# Patient Record
Sex: Male | Born: 1970 | Race: White | Hispanic: No | Marital: Married
Health system: Midwestern US, Community
[De-identification: ages and names within clinical notes are randomized; demographics above are authoritative.]

## PROBLEM LIST (undated history)

## (undated) MED ORDER — AZITHROMYCIN 250 MG TAB: 250 mg | PACK | ORAL | Status: AC

## (undated) MED ORDER — HYDROCODONE 10 MG-CHLORPHENIRAMINE 8 MG/5 ML ORAL SUSP EXTEND.REL 12HR: 10-8 mg/5 mL | Freq: Two times a day (BID) | ORAL | Status: AC | PRN

---

## 2013-02-12 NOTE — ED Notes (Signed)
Seen, treated and released per Dr Kotsko.  DC RN Only.

## 2013-02-12 NOTE — ED Notes (Signed)
Chest congestion--dry cough--hoarse--can expectorate green sputum.

## 2013-02-12 NOTE — ED Provider Notes (Signed)
HPI Comments: Noah Beltran is a 43 y.o. male with no PMH who presents to the ED with complaints of chest congestion and cough for two days. The patient states that Noah Beltran can feel sputum in his chest that Noah Beltran states Noah Beltran has trouble coughing it up. The cough is productive with a green sputum. The patient denies body aches, fever, nausea, vomiting, shortness of breath, chest pain, and any other complaints at this time.     The history is provided by the patient.        History reviewed. No pertinent past medical history.     Past Surgical History   Procedure Laterality Date   ??? Hx orthopaedic  2004     Right index finger--benign tumor removed         History reviewed. No pertinent family history.     History     Social History   ??? Marital Status: MARRIED     Spouse Name: N/A     Number of Children: N/A   ??? Years of Education: N/A     Occupational History   ??? Not on file.     Social History Main Topics   ??? Smoking status: Former Smoker   ??? Smokeless tobacco: Never Used   ??? Alcohol Use: No   ??? Drug Use: No   ??? Sexually Active: Not on file     Other Topics Concern   ??? Not on file     Social History Narrative   ??? No narrative on file                  ALLERGIES: Penicillins      Review of Systems   Constitutional: Negative for fever and chills.   HENT: Negative for congestion and sore throat.    Eyes: Negative for redness and visual disturbance.   Respiratory: Positive for cough. Negative for shortness of breath.    Cardiovascular: Negative for chest pain.   Gastrointestinal: Negative for vomiting, abdominal pain and diarrhea.   Genitourinary: Negative for difficulty urinating.   Musculoskeletal: Negative for myalgias and arthralgias.   Skin: Negative for rash.   Neurological: Negative for headaches.   Psychiatric/Behavioral: Negative for dysphoric mood.   All other systems reviewed and are negative.        Filed Vitals:    02/12/13 0434   BP: 141/95   Pulse: 94   Temp: 98.4 ??F (36.9 ??C)   Resp: 14   Height: 5\' 9"  (1.753 m)    Weight: 95.255 kg (210 lb)   SpO2: 100%            Physical Exam   Nursing note and vitals reviewed.  Constitutional: Noah Beltran is oriented to person, place, and time. Noah Beltran appears well-developed and well-nourished. No distress.   HENT:   Head: Normocephalic and atraumatic.   Mouth/Throat: Oropharynx is clear and moist.   Eyes: Conjunctivae and EOM are normal. Pupils are equal, round, and reactive to light. No scleral icterus.   Neck: Normal range of motion. Neck supple.   Cardiovascular: Normal rate, regular rhythm and normal heart sounds.    No murmur heard.  Pulmonary/Chest: Effort normal and breath sounds normal. No respiratory distress.   Mild bronchiole sounds bilaterally.    Abdominal: Soft. Bowel sounds are normal. Noah Beltran exhibits no distension. There is no tenderness.   Musculoskeletal: Noah Beltran exhibits no edema.   Lymphadenopathy:     Noah Beltran has no cervical adenopathy.   Neurological: Noah Beltran is alert and  oriented to person, place, and time. Coordination normal.   Skin: Skin is warm and dry. No rash noted.   Psychiatric: Noah Beltran has a normal mood and affect. His behavior is normal.        MDM     Differential Diagnosis; Clinical Impression; Plan:     Bronchitis abx fu pcp   Amount and/or Complexity of Data Reviewed:    Review and summarize past medical records:  Yes      Procedures    -------------------------------------------------------------------------------------------------------------------  Orders:  Orders Placed This Encounter   ??? chlorpheniramine-HYDROcodone (TUSSIONEX PENNKINETIC ER) 8-10 mg/5 mL suspension     Sig: Take 5 mL by mouth every twelve (12) hours as needed for Cough.     Dispense:  60 mL     Refill:  0   ??? azithromycin (ZITHROMAX Z-PAK) 250 mg tablet     Sig: Take two tablets today then one tablet daily     Dispense:  1 Package     Refill:  0                EKG Interpretation & Lab Results:   No results found for this or any previous visit (from the past 12 hour(s)).    Radiology Results:   none    Consultations:   none    Progress Notes:  4:42 AM:  Dr. Diona Browner, MD at the bedside evaluating the patient. Answered the patient's questions regarding treatment and the patient understands.     Scribe Attestation:   written by: Beecher Mcardle, (4:42 AM) scribing for and in the presence of Dr.Tabetha Haraway Marlene Lard, MD ED Provider (4:42 AM).    -------------------------------------------------------------------------------------------------------------------    PROVIDER ATTESTATION STATEMENT  I personally performed the services described in the documentation, reviewed the documentation, as recorded by the scribe in my presence, and it accurately and completely records my words and actions.  Dr.Koray Soter Marlene Lard, MD 3:20 AM      Disposition:  Diagnosis:   1. Acute bronchitis          Disposition: discharged     Follow-up Information    Follow up With Details Comments Contact Info    [facility]  Call in 2 days            Current Discharge Medication List      START taking these medications    Details   chlorpheniramine-HYDROcodone (TUSSIONEX PENNKINETIC ER) 8-10 mg/5 mL suspension Take 5 mL by mouth every twelve (12) hours as needed for Cough.  Qty: 60 mL, Refills: 0      azithromycin (ZITHROMAX Z-PAK) 250 mg tablet Take two tablets today then one tablet daily  Qty: 1 Package, Refills: 0

## 2018-08-21 IMAGING — MR TECH MRI SHOULDER RT WO CONTRAST
4 of 5 series · 30 of 40 positions shown · non-contrast
Comparison: none

HISTORY: pain in right shoulder

Tech exam only.

[Series 6: t2_cor_fs blade · coronal · right · 3.0mm · 0.47mm/px · 7 of 19 slices shown]
[im 1/19]
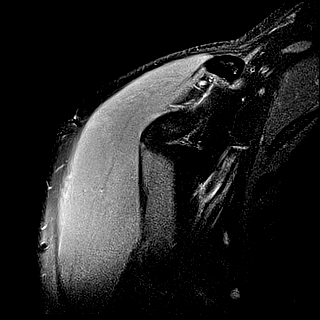
[im 4/19]
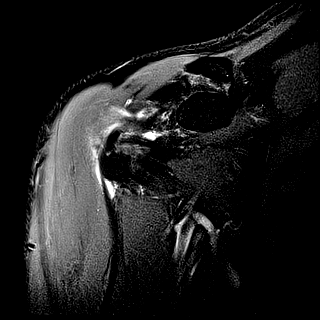
[im 7/19]
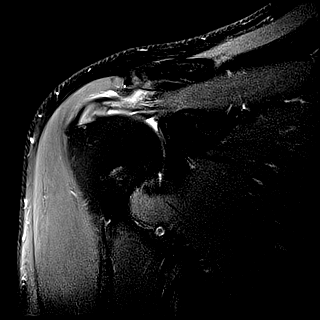
[im 10/19]
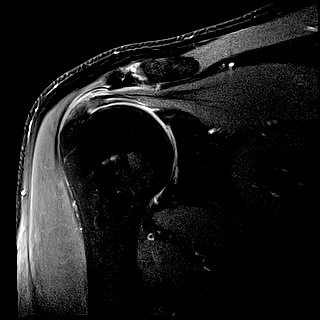
[im 13/19]
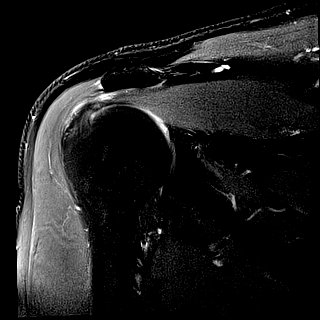
[im 16/19]
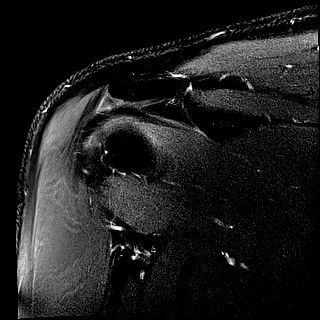
[im 19/19]
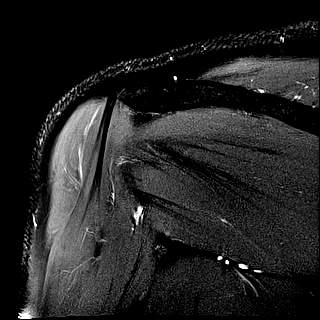

[Series 7: t2_sag_fs(blade) · sagittal · right · 3.0mm · 0.44mm/px · 8 of 28 slices shown]
[im 1/28]
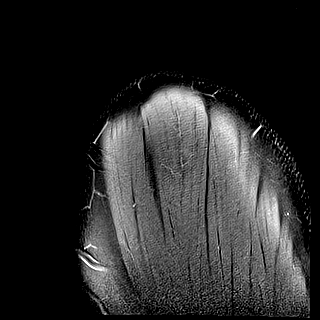
[im 4/28]
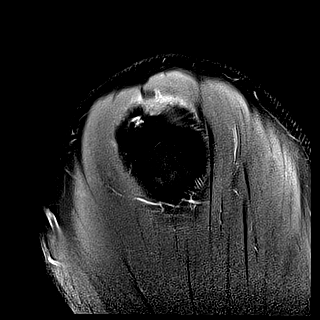
[im 8/28]
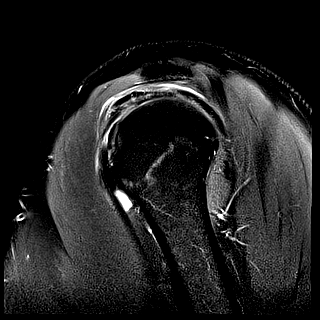
[im 12/28]
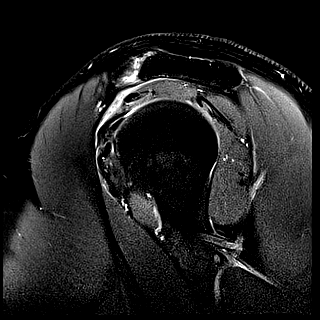
[im 16/28]
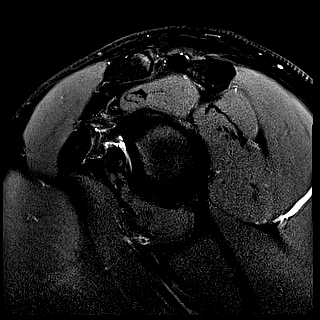
[im 20/28]
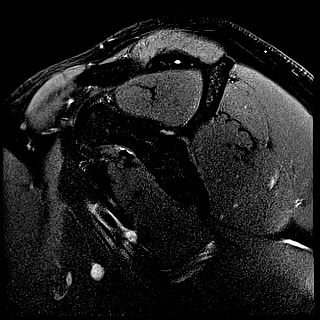
[im 24/28]
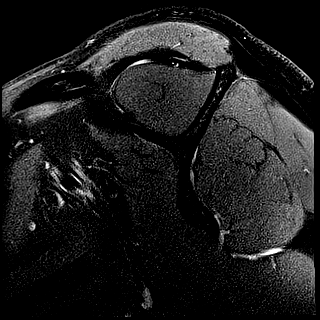
[im 28/28]
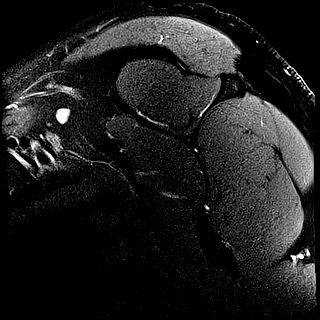

[Series 8: t1_sag_obl · sagittal · right · 3.0mm · 0.36mm/px · 8 of 28 slices shown]
[im 1/28]
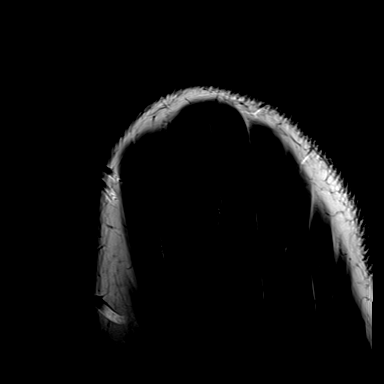
[im 4/28]
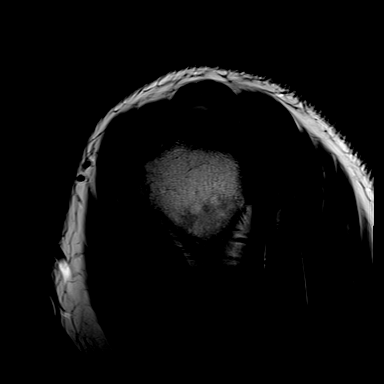
[im 8/28]
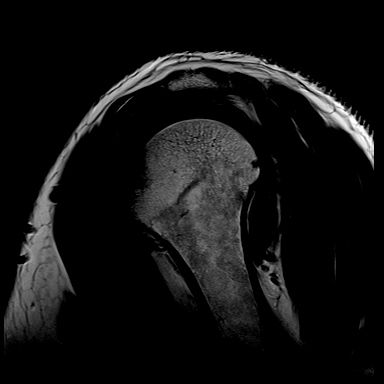
[im 12/28]
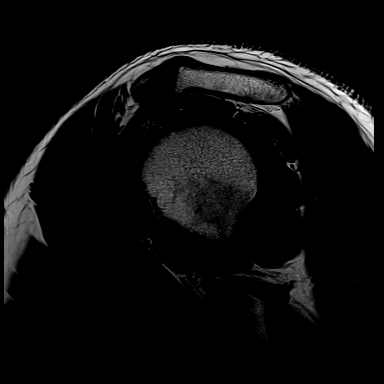
[im 16/28]
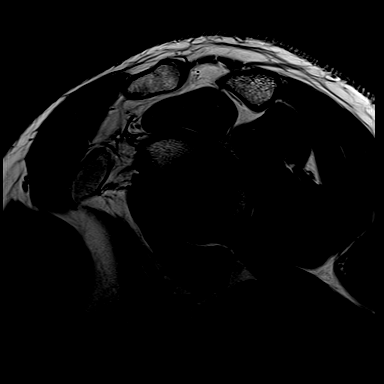
[im 20/28]
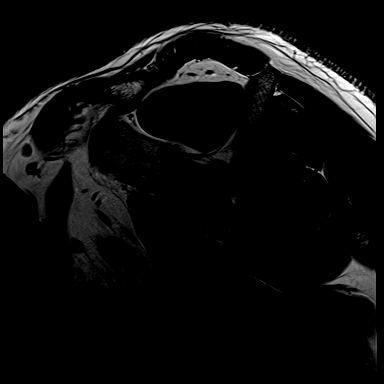
[im 24/28]
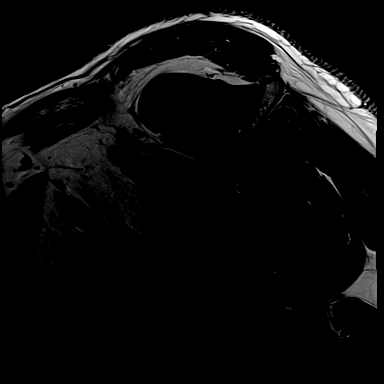
[im 28/28]
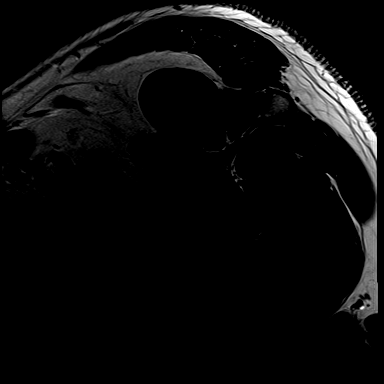

[Series 9: t2_axial_fs_blade · axial · right · 3.0mm · 0.62mm/px · z∈[-42,+50]mm · 7 of 24 slices shown]
[im 1/24]
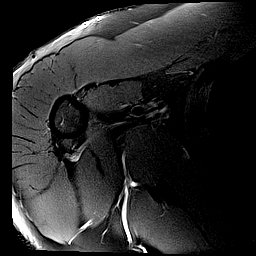
[im 4/24]
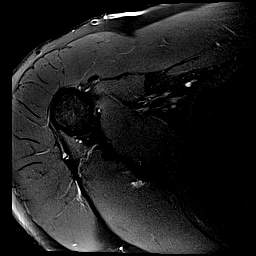
[im 8/24]
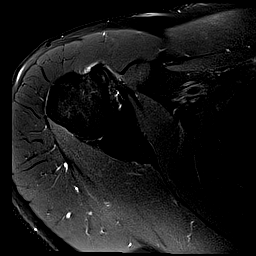
[im 12/24]
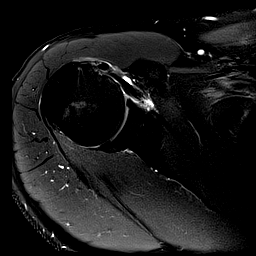
[im 16/24]
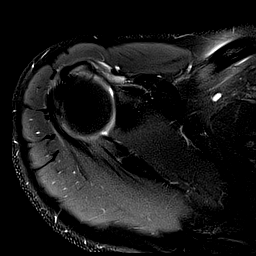
[im 20/24]
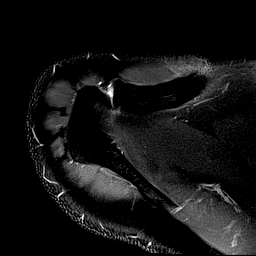
[im 24/24]
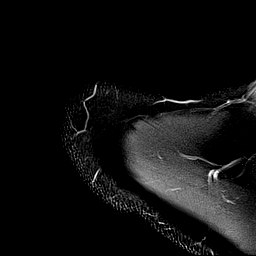

[30 of 40 positions shown; findings below may reference images not displayed]

IMPRESSION: Images not interpreted by [HOSPITAL].

## 2020-06-06 IMAGING — MR MRI BRAIN W/WO CONTRAST
11 series · 48 of 48 positions shown · IV contrast (prohance)
Comparison: None

INDICATION: 50 years-old male with asymmetric tinnitus.
TECHNIQUE: Multiplanar, multisequence MRI of the brain was performed without and with intravenous contrast. The patient received an intravenous dose of 15 mL ProHance. IAC protocol was used.

[Series 5: t1_sag · sagittal · 4.0mm · 0.69mm/px · 2 of 25 slices shown]
[im 1/25]
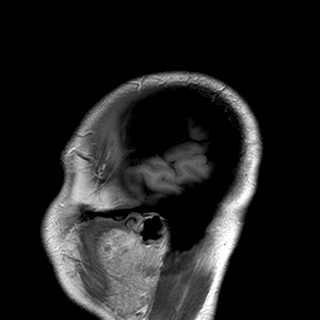
[im 25/25]
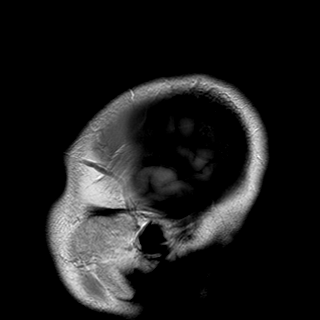

[Series 6: flair_axial_fs · axial · 4.0mm · 0.75mm/px · z∈[-37,+113]mm · 3 of 30 slices shown]
[im 1/30]
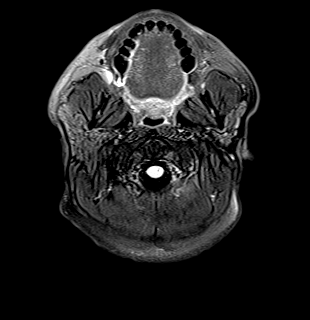
[im 15/30]
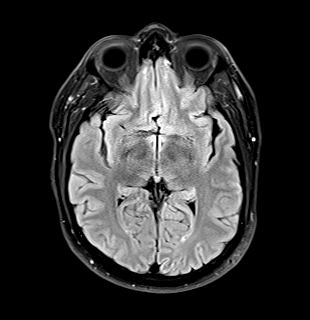
[im 30/30]
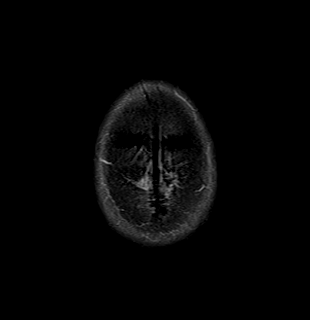

[Series 7: DWI · axial · 4.0mm · 0.94mm/px · z∈[-37,+113]mm · 4 of 30 slices shown (1 of 2)]
[im 1/30]
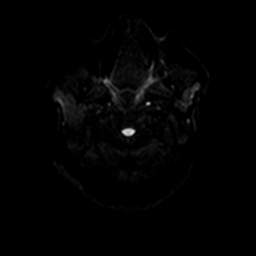
[im 10/30]
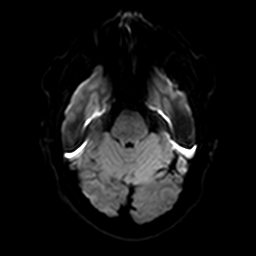
[im 20/30]
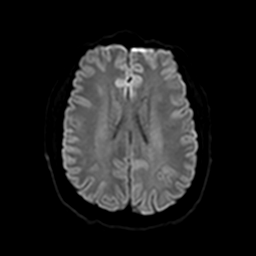
[im 30/30]
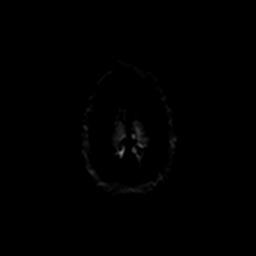

[Series 8: DWI · axial · 4.0mm · 0.94mm/px · z∈[-37,+113]mm · 4 of 30 slices shown (2 of 2)]
[im 1/30]
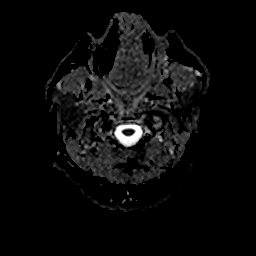
[im 10/30]
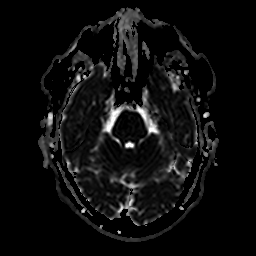
[im 20/30]
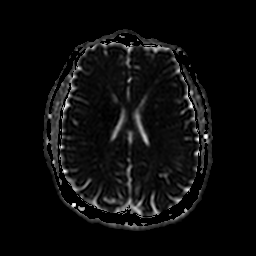
[im 30/30]
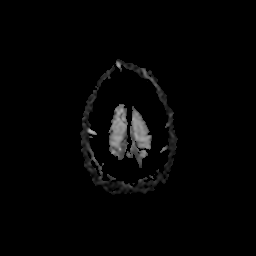

[Series 9: flash_axial · axial · 4.0mm · 0.75mm/px · z∈[-37,+113]mm · 4 of 30 slices shown]
[im 1/30]
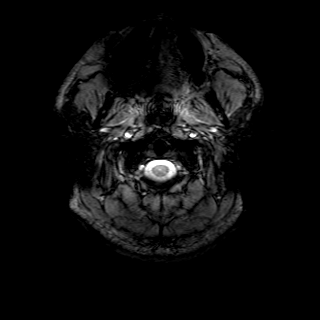
[im 10/30]
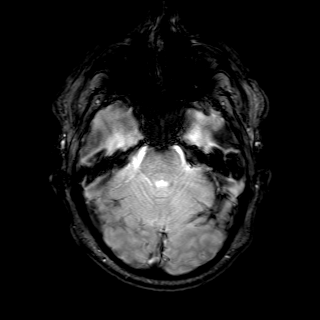
[im 20/30]
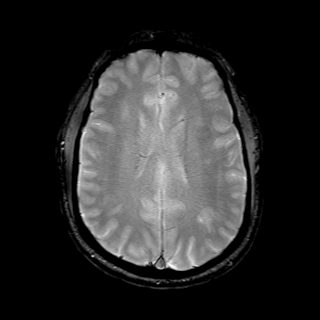
[im 30/30]
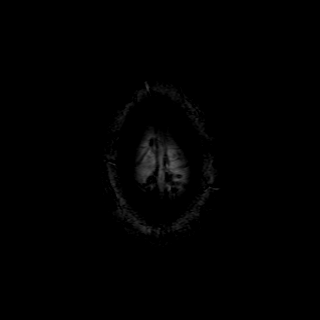

[Series 10: t1_axial · axial · 4.0mm · 0.75mm/px · z∈[-37,+113]mm · 4 of 30 slices shown]
[im 1/30]
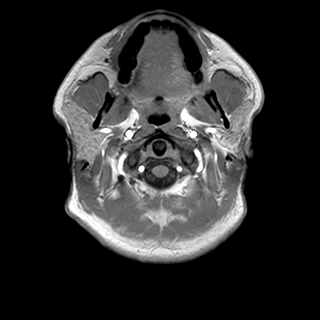
[im 10/30]
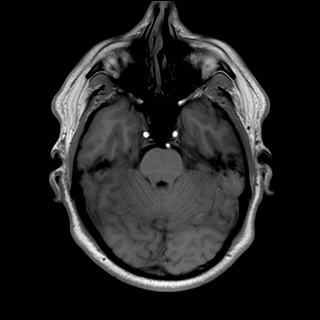
[im 20/30]
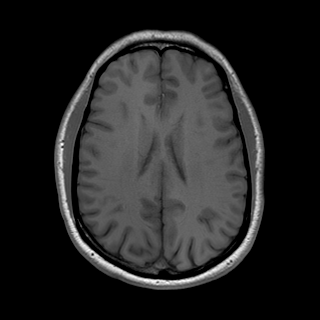
[im 30/30]
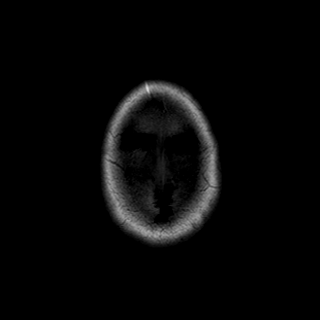

[Series 11: t2_ci3d_axial · axial · 0.9mm · 0.59mm/px · z∈[-25,+21]mm · 6 of 52 slices shown]
[im 1/52]
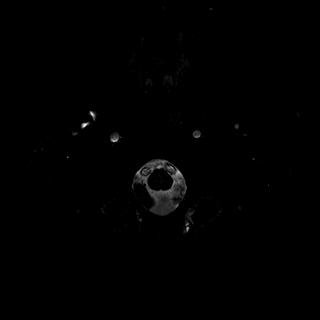
[im 11/52]
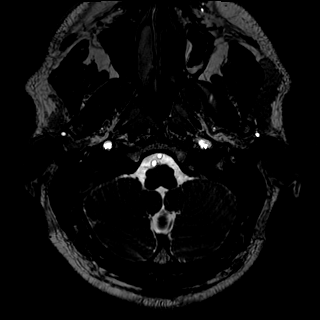
[im 21/52]
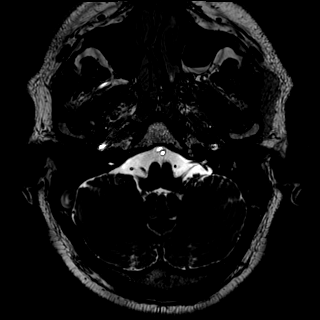
[im 31/52]
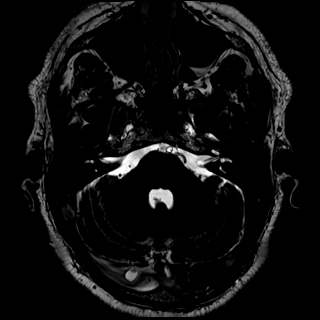
[im 41/52]
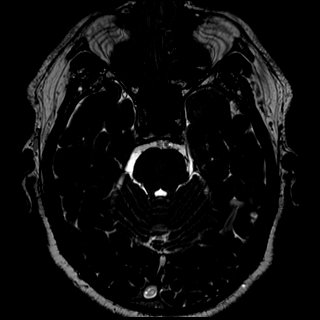
[im 52/52]
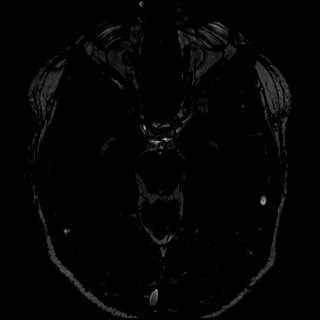

[Series 12: t1_vibe_axial_fs · axial · 1.0mm · 0.66mm/px · z∈[-33,+30]mm · 8 of 64 slices shown]
[im 1/64]
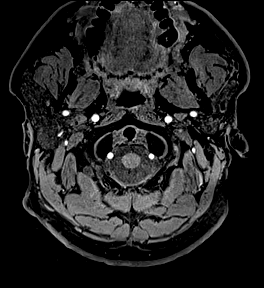
[im 10/64]
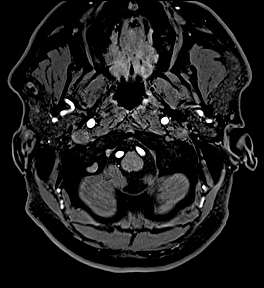
[im 19/64]
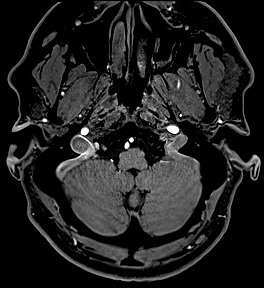
[im 28/64]
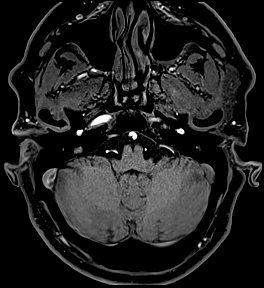
[im 37/64]
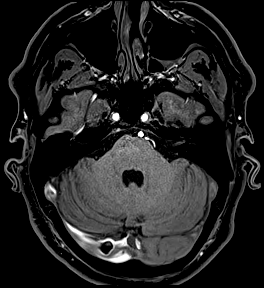
[im 46/64]
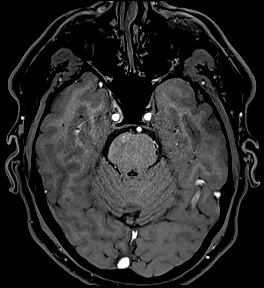
[im 55/64]
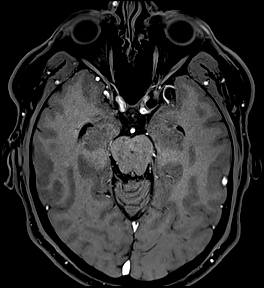
[im 64/64]
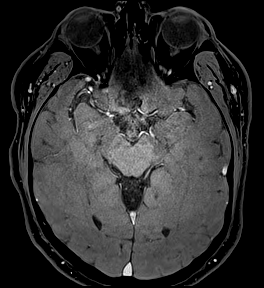

[Series 13: t1_vibe_axial_fs +c · axial · 1.0mm · 0.66mm/px · z∈[-33,+30]mm · 8 of 64 slices shown]
[im 1/64]
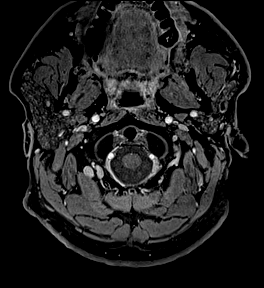
[im 10/64]
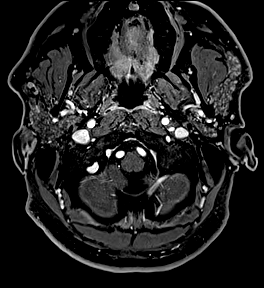
[im 19/64]
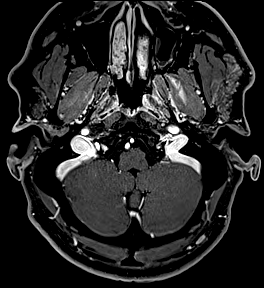
[im 28/64]
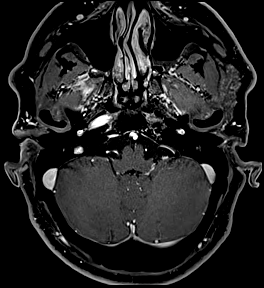
[im 37/64]
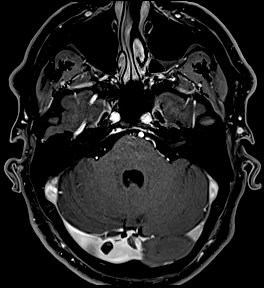
[im 46/64]
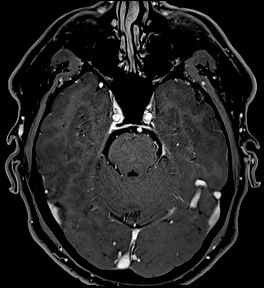
[im 55/64]
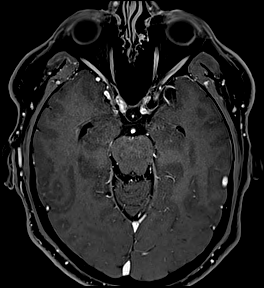
[im 64/64]
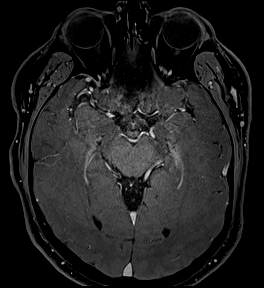

[Series 14: t1_axial_+c · axial · 4.0mm · 0.75mm/px · z∈[-37,+113]mm · 4 of 30 slices shown]
[im 1/30]
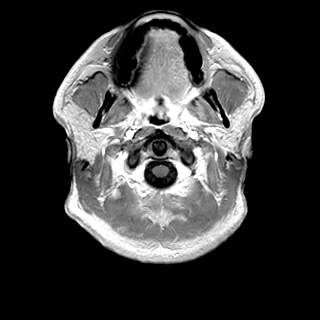
[im 10/30]
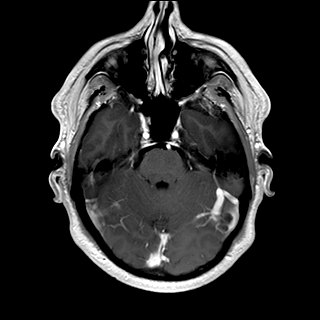
[im 20/30]
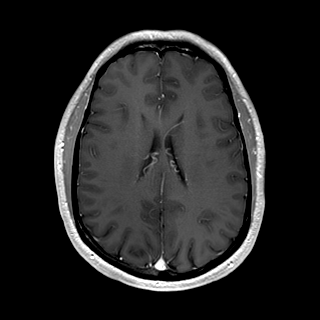
[im 30/30]
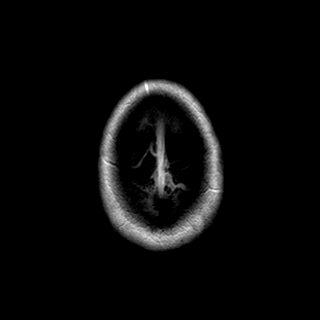

[Series 100: mpr cor iac · coronal · 1.0mm · 0.66mm/px · 1 of 3 slices shown]
[im 1/3]
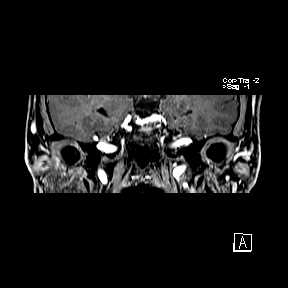

[48 of 48 positions shown; findings below may reference images not displayed]

FINDINGS: Temporal bones: No internal auditory canal mass. No nodularity of cranial nerves VII and VIII. Normal signal of the inner ear structures. No semicircular canal dehiscence. No abnormal contrast enhancement. Normal enhancement of the facial nerves.

Brain parenchyma: No acute infarct or hemorrhage. No parenchymal signal abnormality.

Ventricles: No midline shift, herniation or hydrocephalus.

Extra-axial spaces: No extra-axial fluid collection.

Extracranial structures: Mild mucosal thickening of the bilateral ethmoid air cells. No suspicious osseous lesion. Orbits unremarkable. Soft tissues normal.

Contrast enhanced views are negative for pathologic enhancement.
IMPRESSION: No abnormality to explain patient's symptoms.

## 2020-08-14 IMAGING — MR MRI HAND RT WO CONTRAST
6 of 7 series · 39 of 40 positions shown · non-contrast
Comparison: None.

INDICATION: Pain to right wrist, hand, fingers.
TECHNIQUE: Multiplanar, multiecho MR imaging of the right hand was performed, including T1-weighted and fluid-sensitive sequences without intravenous contrast administration.

[Series 4: t1_cor · coronal · right · 2.0mm · 0.39mm/px · 5 of 26 slices shown]
[im 1/26]
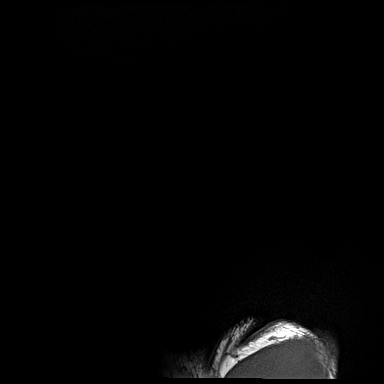
[im 7/26]
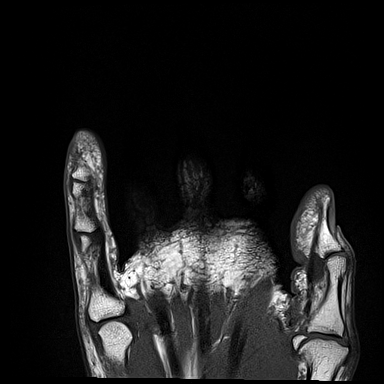
[im 13/26]
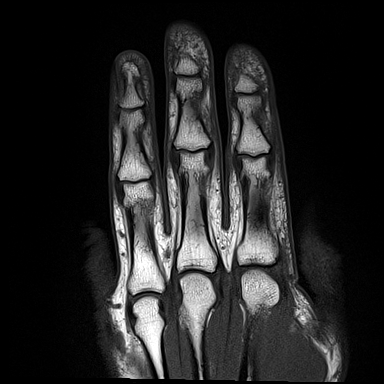
[im 19/26]
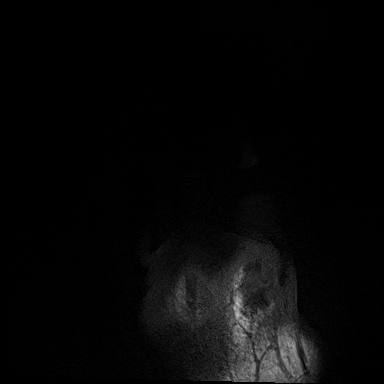
[im 26/26]
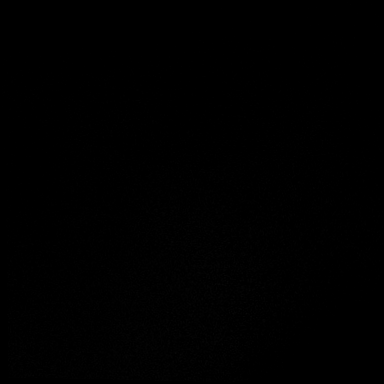

[Series 5: t2_cor_fs · coronal · right · 2.0mm · 0.47mm/px · 5 of 26 slices shown]
[im 1/26]
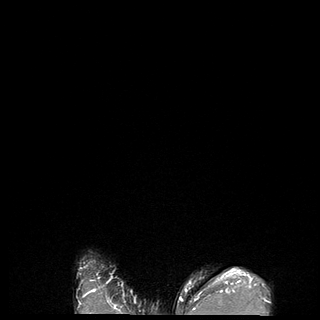
[im 7/26]
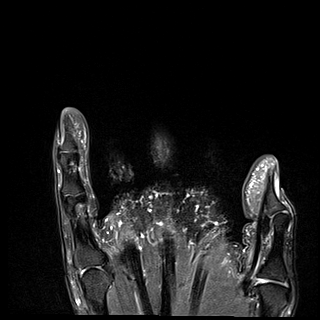
[im 13/26]
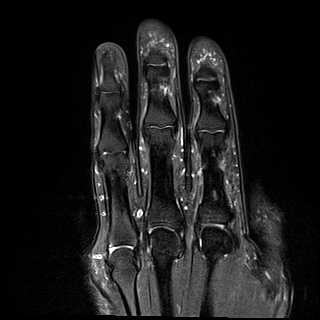
[im 19/26]
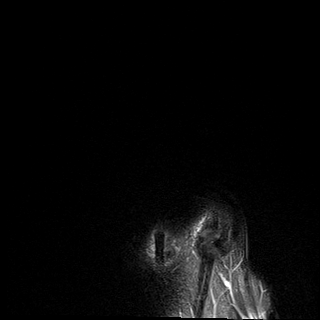
[im 26/26]
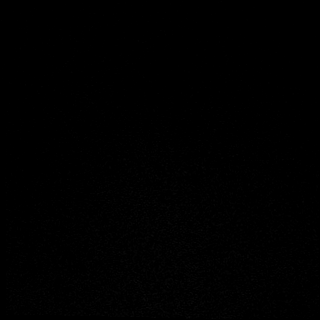

[Series 6: t1_axial · axial · right · 3.0mm · 0.38mm/px · z∈[+84,+199]mm · 8 of 40 slices shown]
[im 1/40]
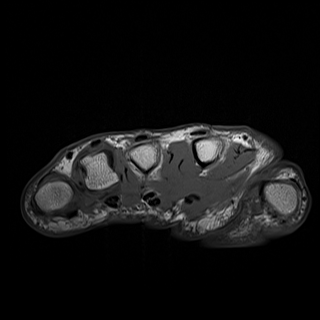
[im 6/40]
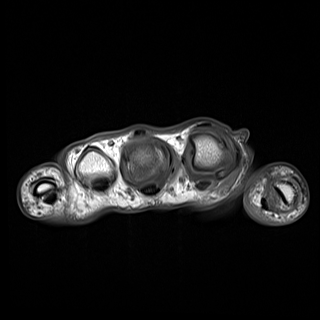
[im 12/40]
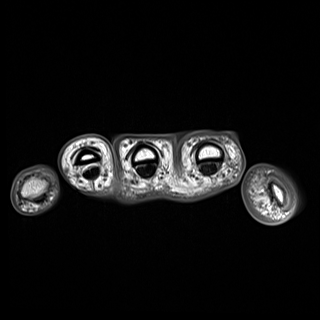
[im 17/40]
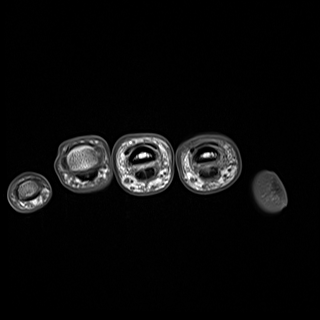
[im 23/40]
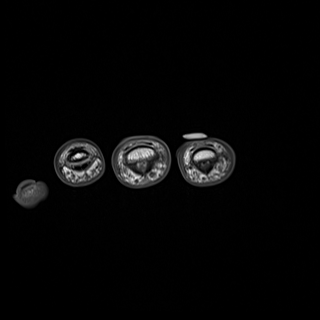
[im 28/40]
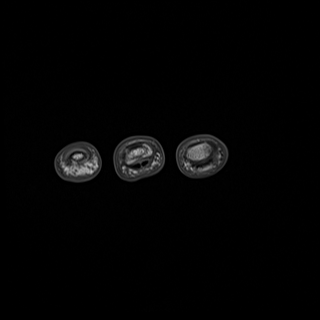
[im 34/40]
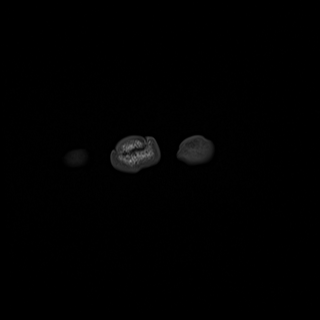
[im 40/40]
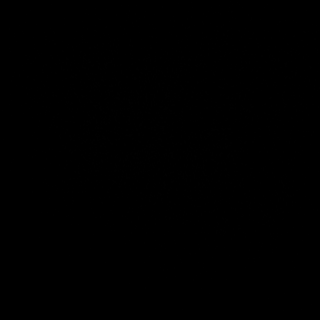

[Series 7: t2_axial_fs · axial · right · 3.0mm · 0.38mm/px · z∈[+84,+199]mm · 8 of 40 slices shown]
[im 1/40]
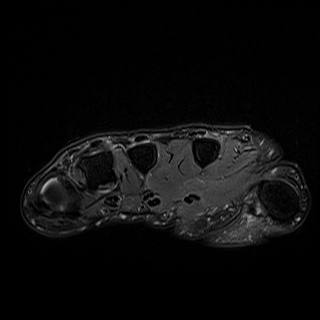
[im 6/40]
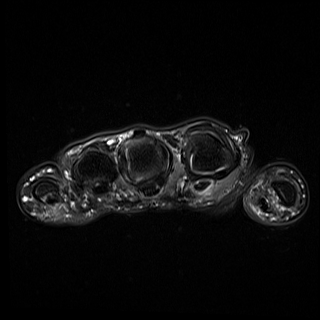
[im 12/40]
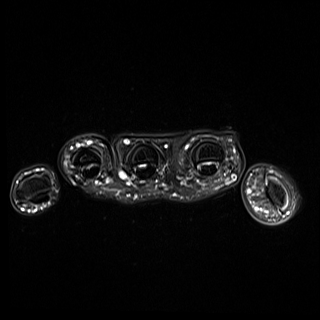
[im 17/40]
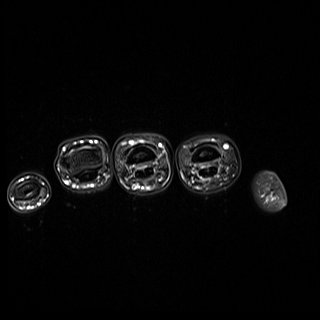
[im 23/40]
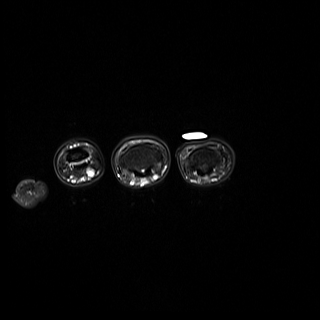
[im 28/40]
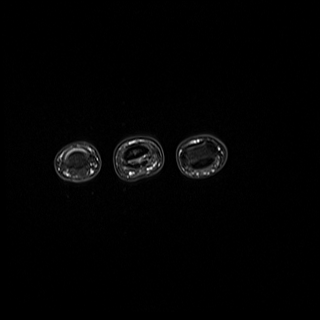
[im 34/40]
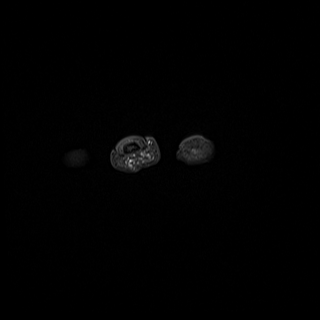
[im 40/40]
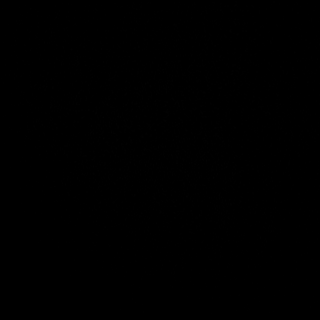

[Series 8: t2_sag_fs · sagittal · right · 3.0mm · 0.47mm/px · 6 of 33 slices shown]
[im 1/33]
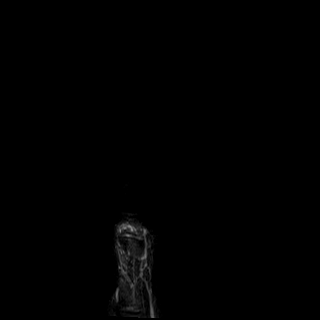
[im 7/33]
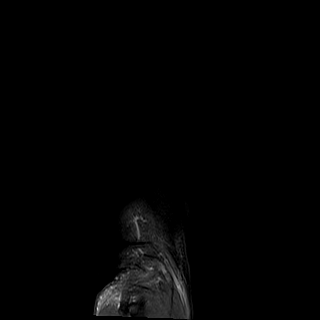
[im 13/33]
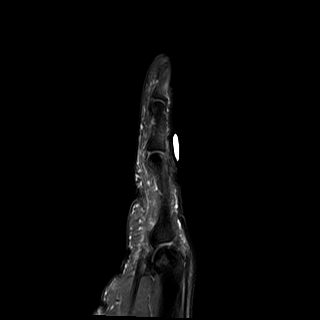
[im 20/33]
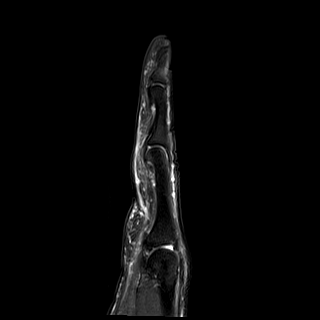
[im 26/33]
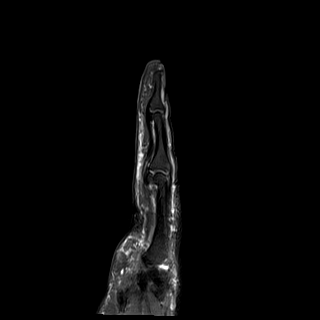
[im 33/33]
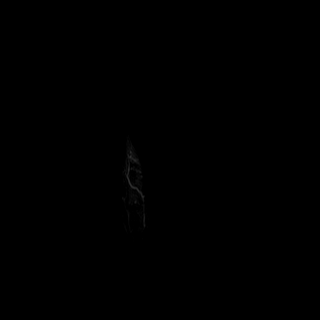

[Series 9: t1_sag · sagittal · right · 3.0mm · 0.47mm/px · 7 of 34 slices shown]
[im 1/34]
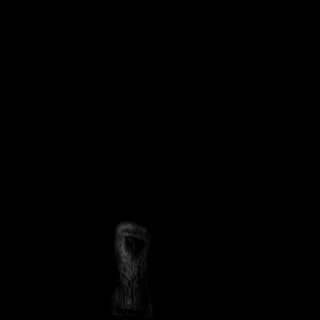
[im 6/34]
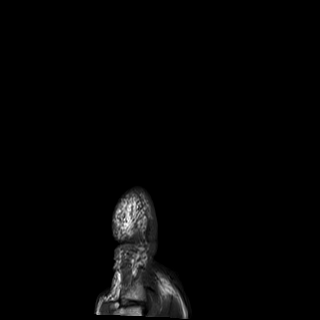
[im 12/34]
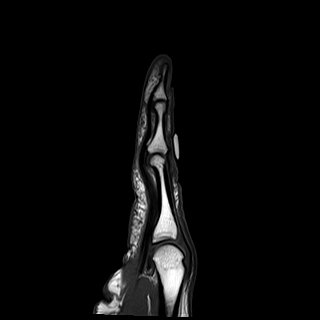
[im 17/34]
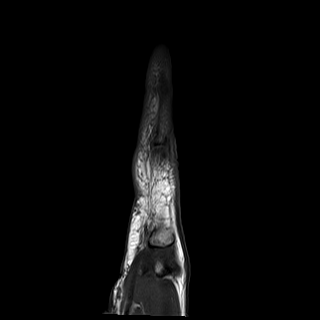
[im 23/34]
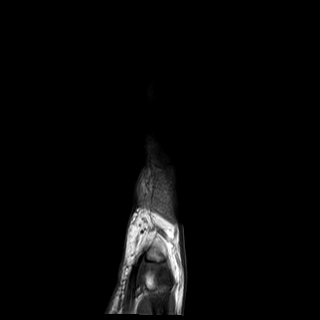
[im 28/34]
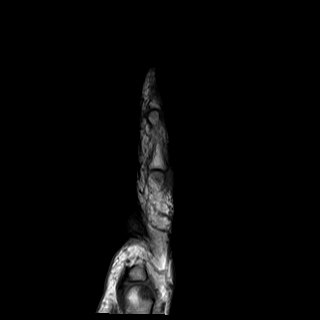
[im 34/34]
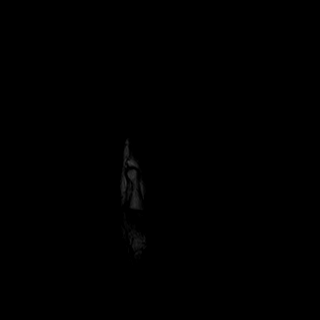

[39 of 40 positions shown; findings below may reference images not displayed]

FINDINGS: No acute fracture. No bone contusion. No erosion. Mild first MCP joint DJD. No joint effusion.

Collateral ligaments are intact. No tenosynovitis. Visualized flexor and extensor tendons are intact. No acute muscle injury. No muscle atrophy. No fluid collection. Pulley systems are intact. No suspicious soft tissue mass.

No soft tissue abnormality at the region of interest along the dorsal aspect of the second digit.
IMPRESSION: 1.
No acute osseous finding.

2.
No soft tissue abnormality at the region of interest along the dorsal aspect of the second digit.

3.
Mild first MCP joint DJD.

## 2020-08-14 IMAGING — MR MRI WRIST RT WO CONTRAST
6 of 7 series · 37 of 40 positions shown · non-contrast
Comparison: None.

INDICATION: Pain to right wrist, hand, fingers.
TECHNIQUE: Multiplanar, multiecho imaging of the right wrist was performed, including T1-weighted and fluid sensitive sequences without intravenous contrast administration.

[Series 4: t1_axial · axial · right · 3.0mm · 0.26mm/px · z∈[-50,+14]mm · 6 of 22 slices shown]
[im 1/22]
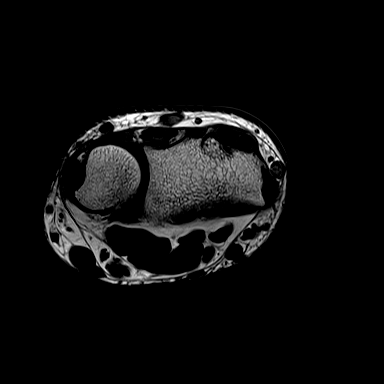
[im 5/22]
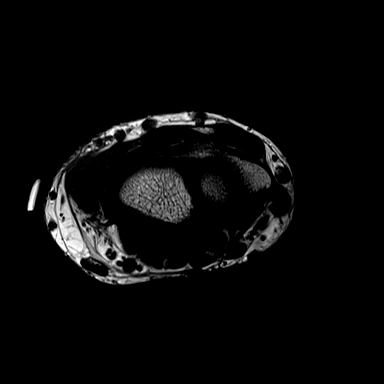
[im 9/22]
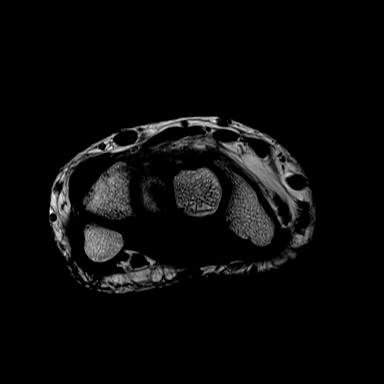
[im 13/22]
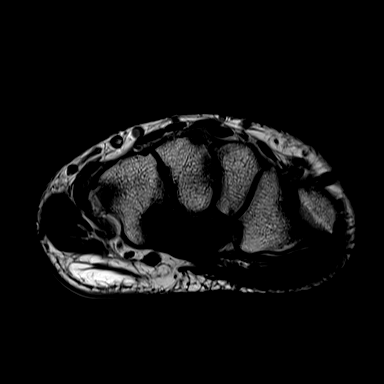
[im 17/22]
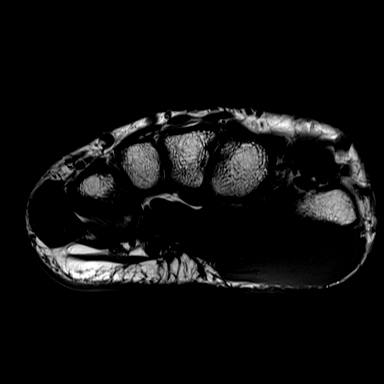
[im 22/22]
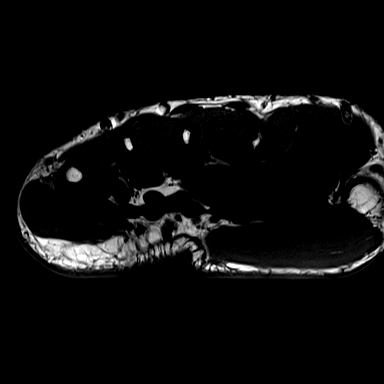

[Series 5: t2_axial_fs · axial · right · 3.0mm · 0.32mm/px · z∈[-49,+15]mm · 6 of 22 slices shown]
[im 1/22]
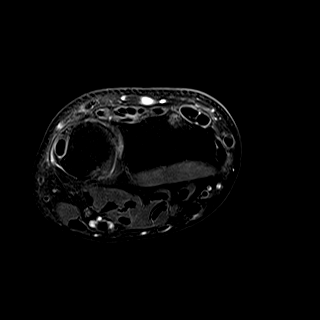
[im 5/22]
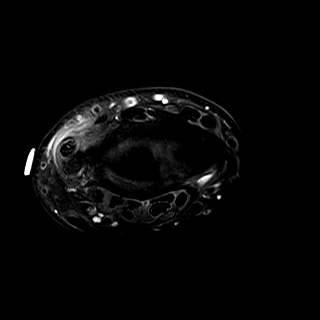
[im 9/22]
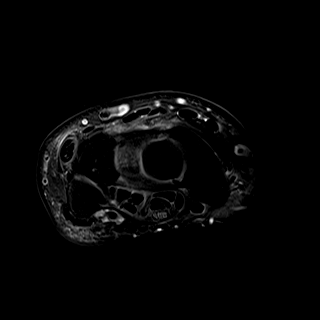
[im 13/22]
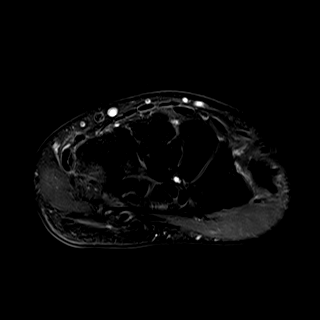
[im 17/22]
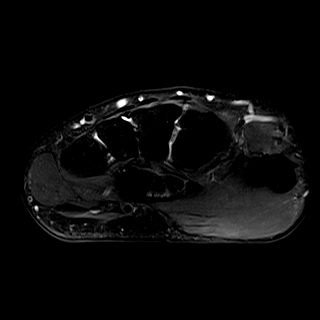
[im 22/22]
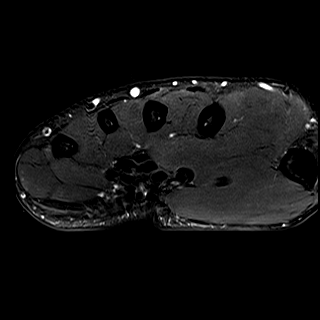

[Series 6: t1_cor · coronal · right · 3.0mm · 0.26mm/px · 4 of 15 slices shown]
[im 1/15]
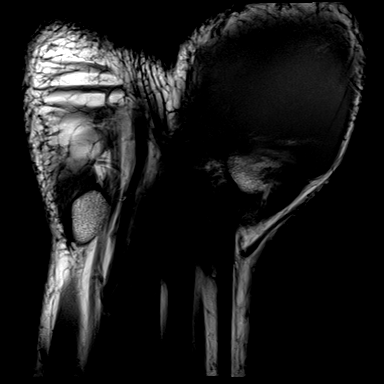
[im 5/15]
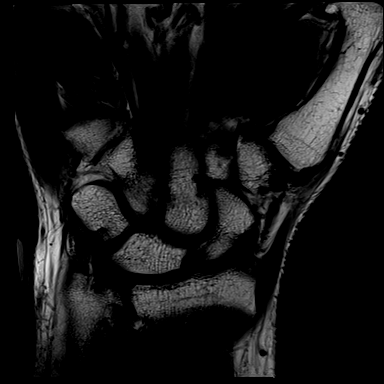
[im 10/15]
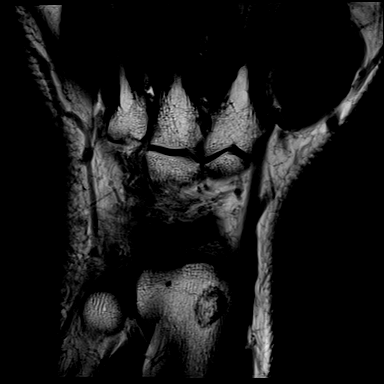
[im 15/15]
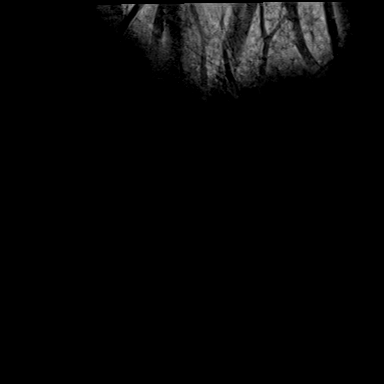

[Series 8: t2_sag_fs · sagittal · right · 3.0mm · 0.39mm/px · 6 of 22 slices shown]
[im 1/22]
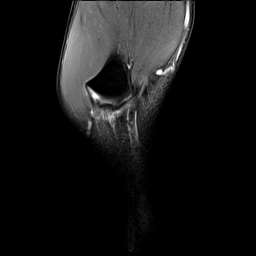
[im 5/22]
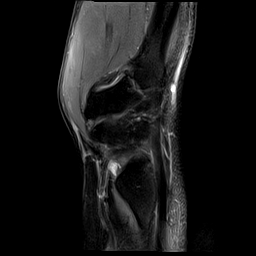
[im 9/22]
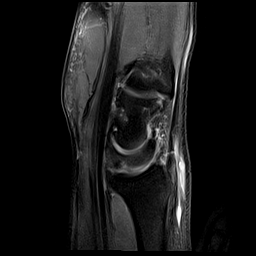
[im 13/22]
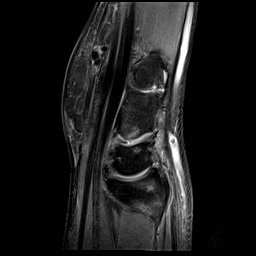
[im 17/22]
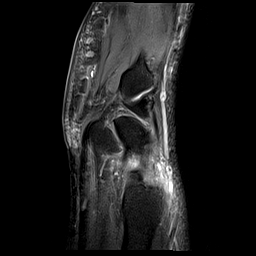
[im 22/22]
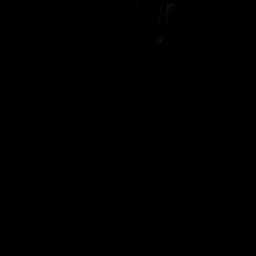

[Series 9: t2_cor_fs_space · coronal · right · 0.9mm · 0.33mm/px · 11 of 42 slices shown]
[im 1/42]
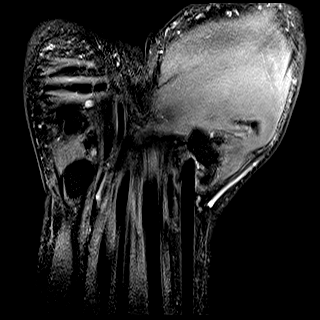
[im 5/42]
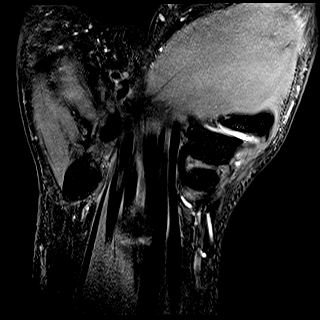
[im 9/42]
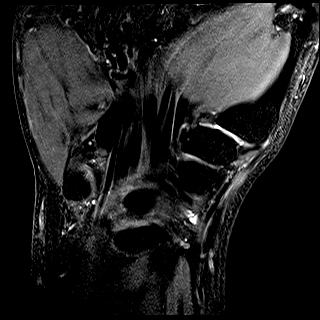
[im 13/42]
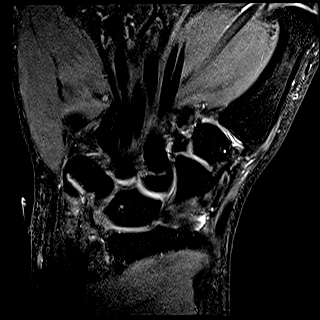
[im 17/42]
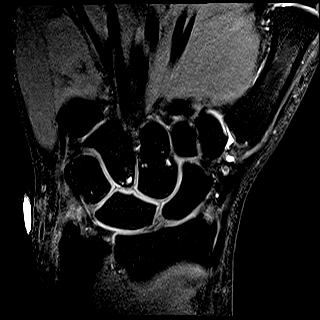
[im 21/42]
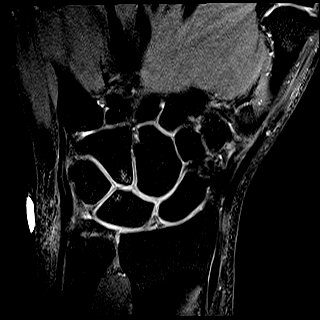
[im 25/42]
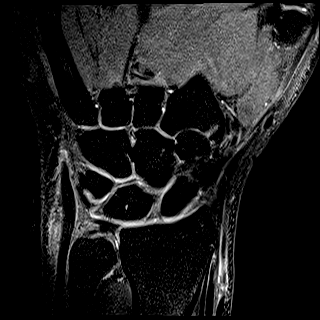
[im 29/42]
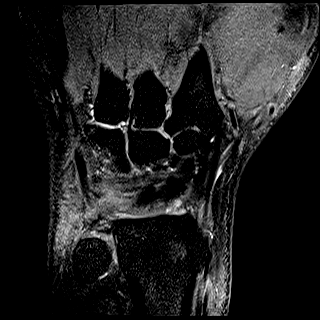
[im 33/42]
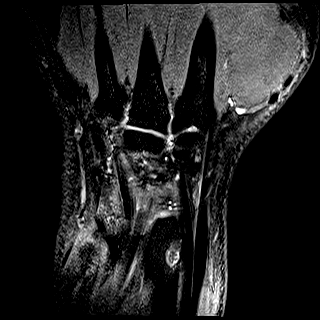
[im 37/42]
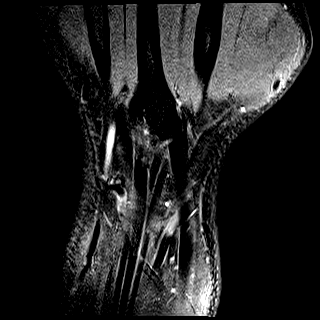
[im 42/42]
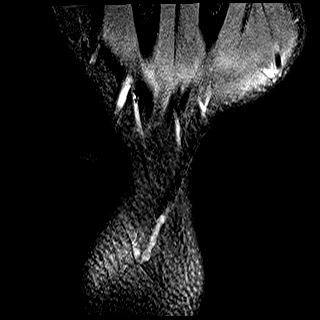

[Series 10: t2_cor_fs · coronal · right · 3.0mm · 0.31mm/px · 4 of 15 slices shown]
[im 1/15]
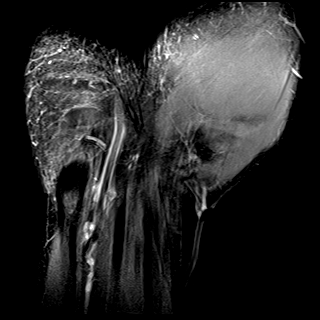
[im 5/15]
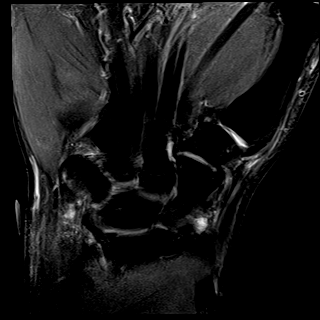
[im 10/15]
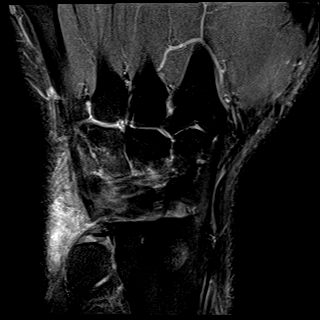
[im 15/15]
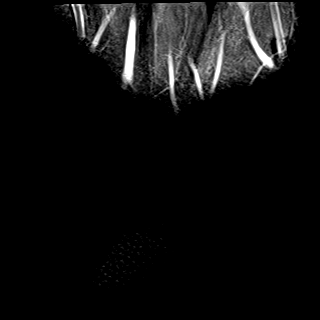

[37 of 40 positions shown; findings below may reference images not displayed]

FINDINGS: Extensor tendons: Moderate tendinosis of the extensor carpi ulnaris, at the level of the ulnar styloid.

Flexor tendons:  Intact. No tenosynovitis.

Triangular fibrocartilage complex: TFC is intact. Degeneration of the meniscal homolog. Thickening and scarring of the ulnar collateral ligament of the wrist, suggesting old injury.

Scapholunate ligament: Intact.

Lunotriquetral ligament: Intact.

Carpal bones: No acute fracture. No bone contusion. No erosion. No AVN.

Cartilage: Intact.

Carpal tunnel: Normal.

Other: No acute muscle injury. Moderate subcutaneous edema near the ulnar styloid. Subcortical marrow edema at the dorsal aspect of the distal radius, at Lister's tubercle; likely contusion versus traction edema related to the overlying tendons.
IMPRESSION: 1.
Moderate tendinosis of the extensor carpi ulnaris, at the level of the ulnar styloid.

2.
Focal subcortical marrow edema at the dorsal aspect of the distal radius, at Lister's tubercle; likely small bone contusion versus traction edema related to the overlying tendinopathy.

## 2020-09-30 IMAGING — MR MRI LSPINE WO CONTRAST
7 series · 47 of 48 positions shown · non-contrast
Comparison: None.

INDICATION: Dorsalgia.
TECHNIQUE: Multiplanar, multiecho MR imaging of the lumbar spine was performed, including T1-weighted and fluid sensitive sequences without intravenous contrast.

[Series 16: t2_sag · sagittal · 4.0mm · 0.77mm/px · 6 of 16 slices shown]
[im 1/16]
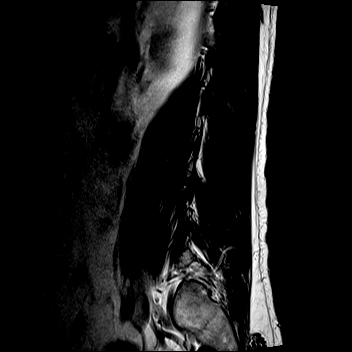
[im 4/16]
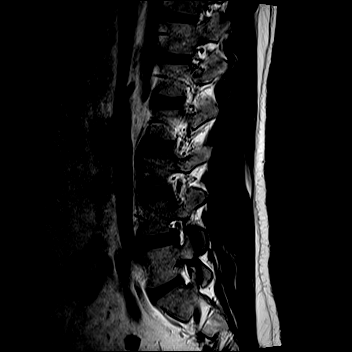
[im 7/16]
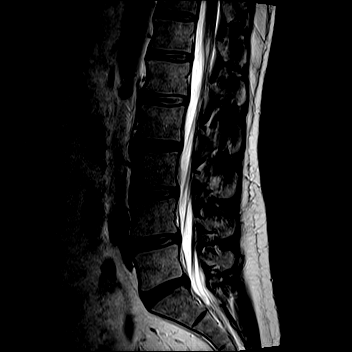
[im 10/16]
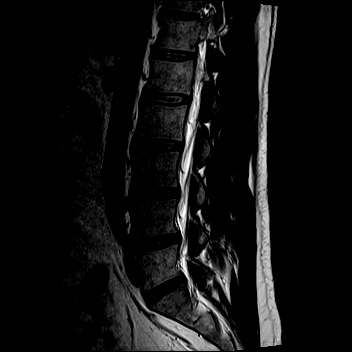
[im 13/16]
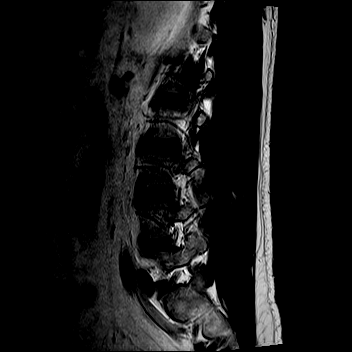
[im 16/16]
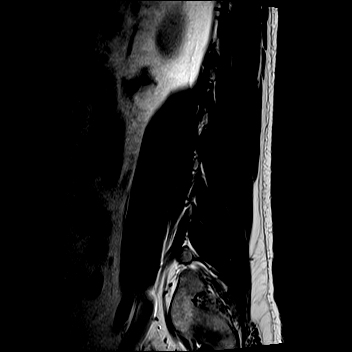

[Series 17: t1_sag · sagittal · 4.0mm · 0.84mm/px · 7 of 16 slices shown]
[im 1/16]
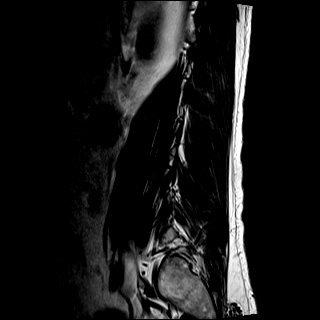
[im 3/16]
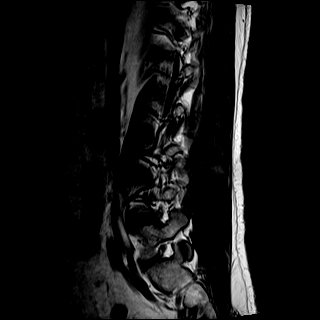
[im 6/16]
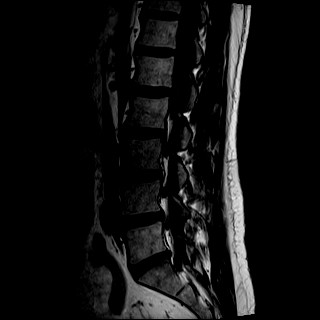
[im 8/16]
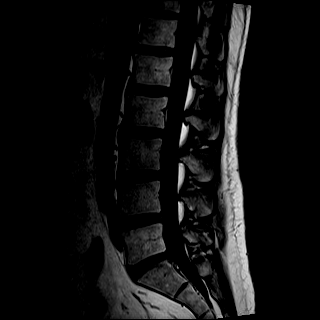
[im 11/16]
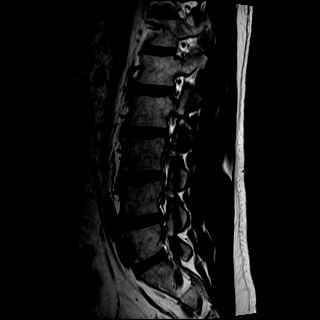
[im 13/16]
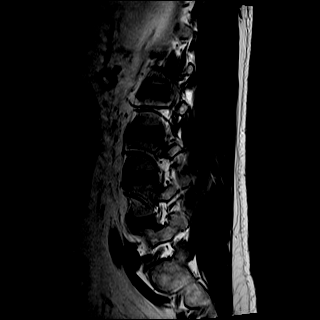
[im 16/16]
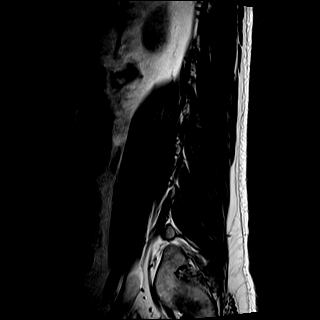

[Series 18: ir_sag · sagittal · 4.0mm · 0.94mm/px · 7 of 16 slices shown]
[im 1/16]
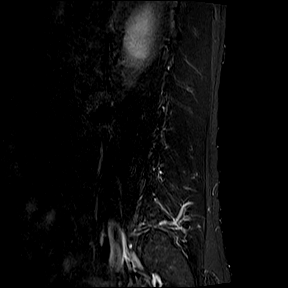
[im 3/16]
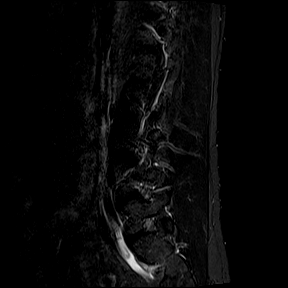
[im 6/16]
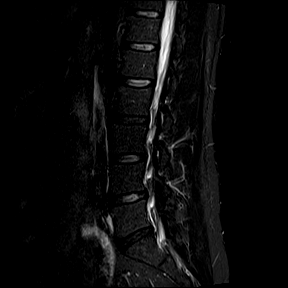
[im 8/16]
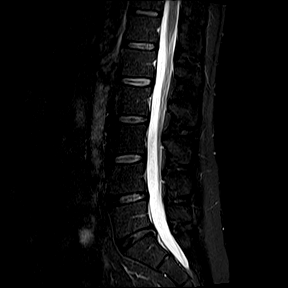
[im 11/16]
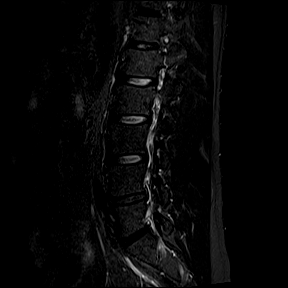
[im 13/16]
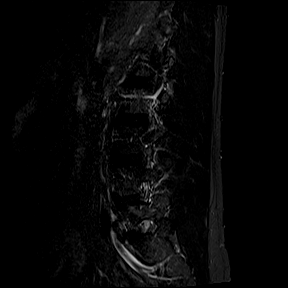
[im 16/16]
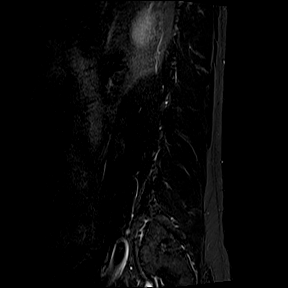

[Series 19: t2_axial · axial · 4.0mm · 0.62mm/px · z∈[-524,-340]mm · 15 of 38 slices shown]
[im 1/38]
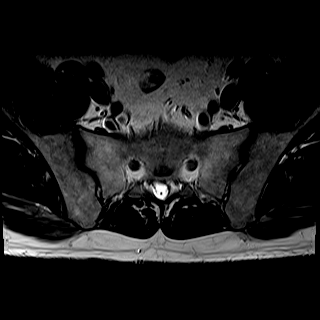
[im 3/38]
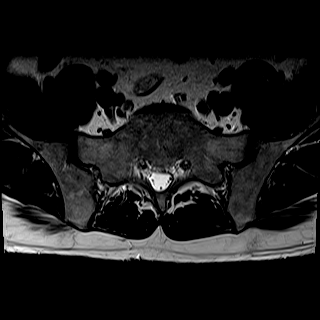
[im 6/38]
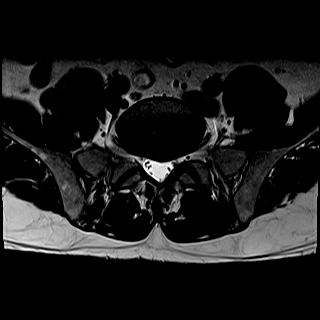
[im 8/38]
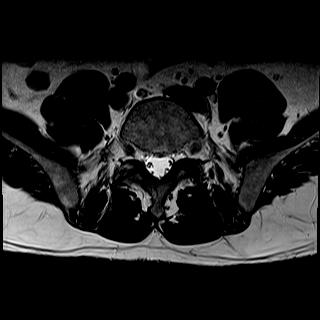
[im 11/38]
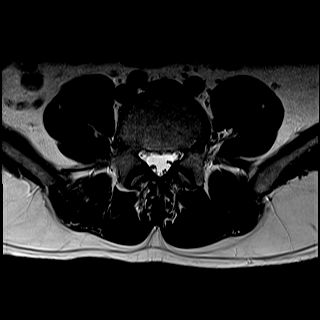
[im 14/38]
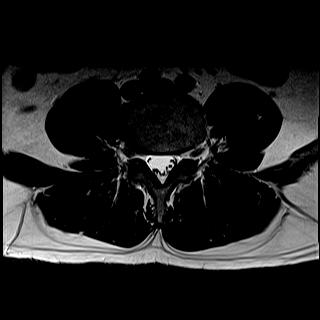
[im 16/38]
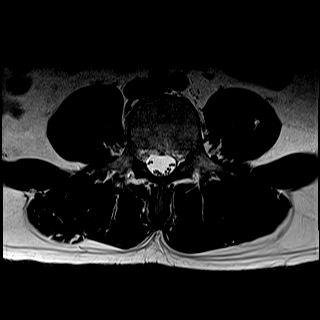
[im 19/38]
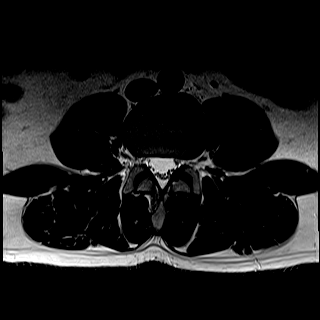
[im 22/38]
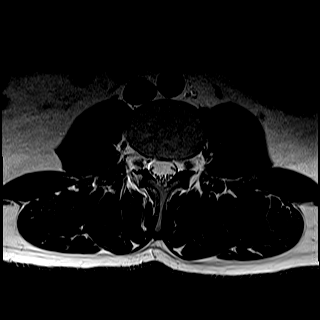
[im 24/38]
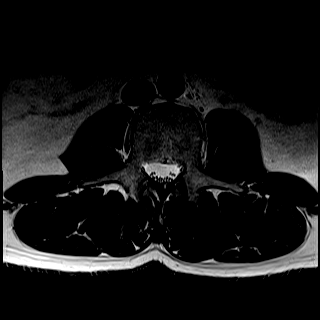
[im 27/38]
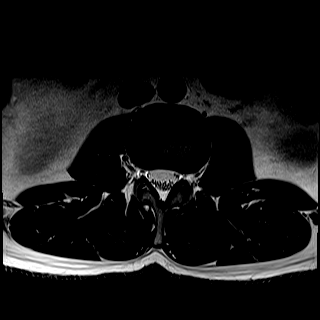
[im 30/38]
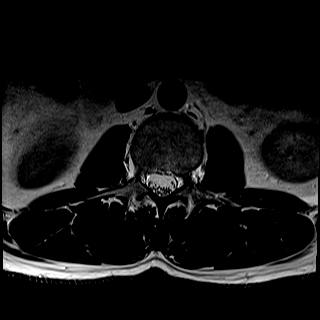
[im 32/38]
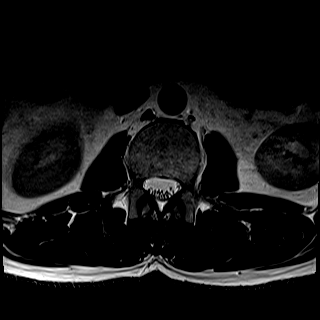
[im 35/38]
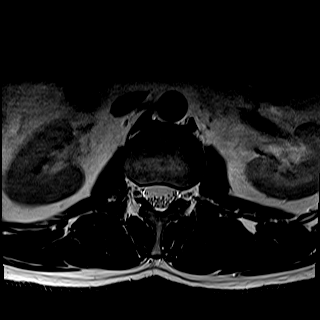
[im 38/38]
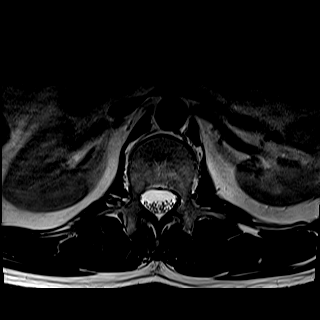

[Series 20: t1_axial_obl · axial · 3.0mm · 0.78mm/px · z∈[-531,-329]mm · 10 of 26 slices shown]
[im 1/26]
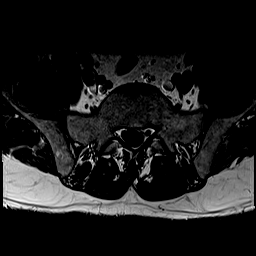
[im 3/26]
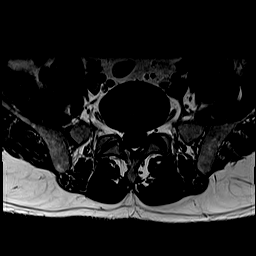
[im 6/26]
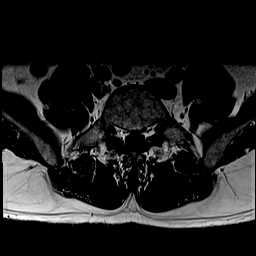
[im 8/26]
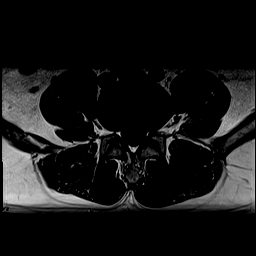
[im 11/26]
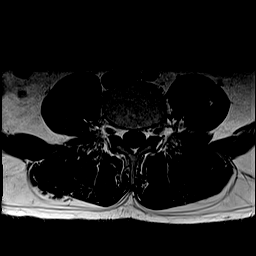
[im 13/26]
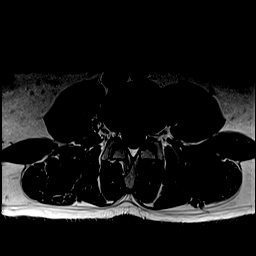
[im 16/26]
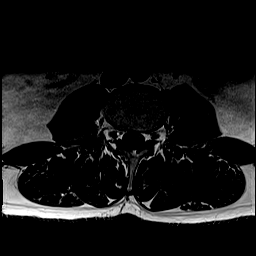
[im 18/26]
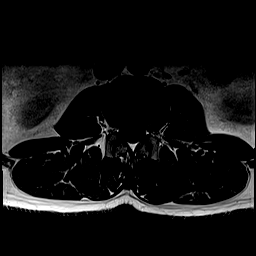
[im 21/26]
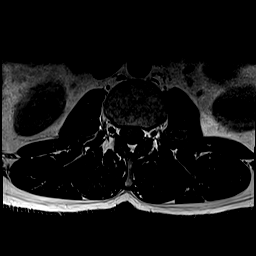
[im 26/26]
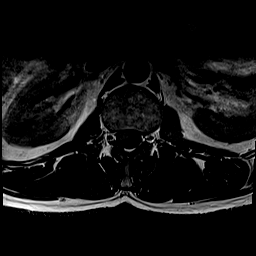

[Series 5000: autoalign verification · 4.0mm · 0.53mm/px · 1 of 3 slices shown (1 of 2)]
[im 1/3]
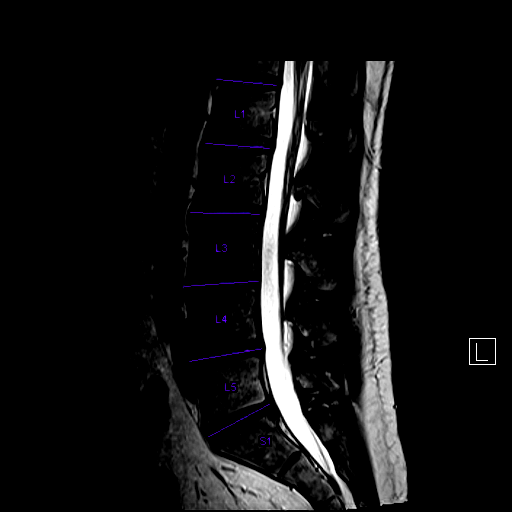

[Series 5000: autoalign verification · sagittal · 4.0mm · 0.77mm/px · 1 of 3 slices shown (2 of 2)]
[im 1/3]
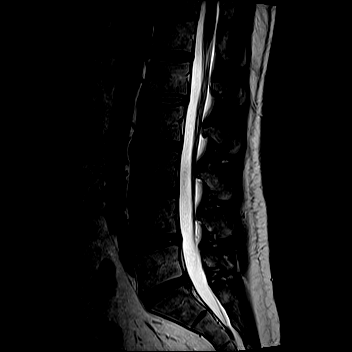

[47 of 48 positions shown; findings below may reference images not displayed]

FINDINGS: Conus: The conus is normal in appearance and position.  

Alignment: The alignment of the lumbar spine is normal on these supine, neutral images.   

Marrow: No acute fracture. No pars defect. Minimal scattered discogenic endplate reactive marrow changes.  

T11-T12 and T12-L1 levels are seen only on the sagittal images; no posterior disc bulge, canal narrowing, or foraminal narrowing at these levels.

L1-L2: No disc protrusion, canal stenosis, or foraminal stenosis.

L2-L3: No disc protrusion, canal stenosis, or foraminal stenosis.

L3-L4: Minimal disc bulge. No canal or foraminal stenosis. No facet arthrosis.

L4-L5: Mild disc bulge. No canal stenosis. Mild right foraminal stenosis. No facet arthrosis.

L5-S1: Mild disc bulge. No facet arthrosis. No canal stenosis. No foraminal stenosis.

Visualized SI joints: Intact

Visualized soft tissues: Unremarkable
IMPRESSION: 1.
Mild disc bulge at L4-L5 with mild right foraminal stenosis. No canal stenosis.

2.
Mild disc bulge at L5-S1 with no canal or foraminal stenosis.

## 2022-09-15 IMAGING — CR KNEE LT 1-2 VWS
1 series · 2 of 2 positions shown · non-contrast
Comparison: None

Images Obtained from [facility]
Left knee radiographs, 2 views
INDICATION: Encounter for general adult medical examination without abnormal findings

[Series 1: ap · 0.17mm/px · 2 of 2 slices shown]
[im 1/2]
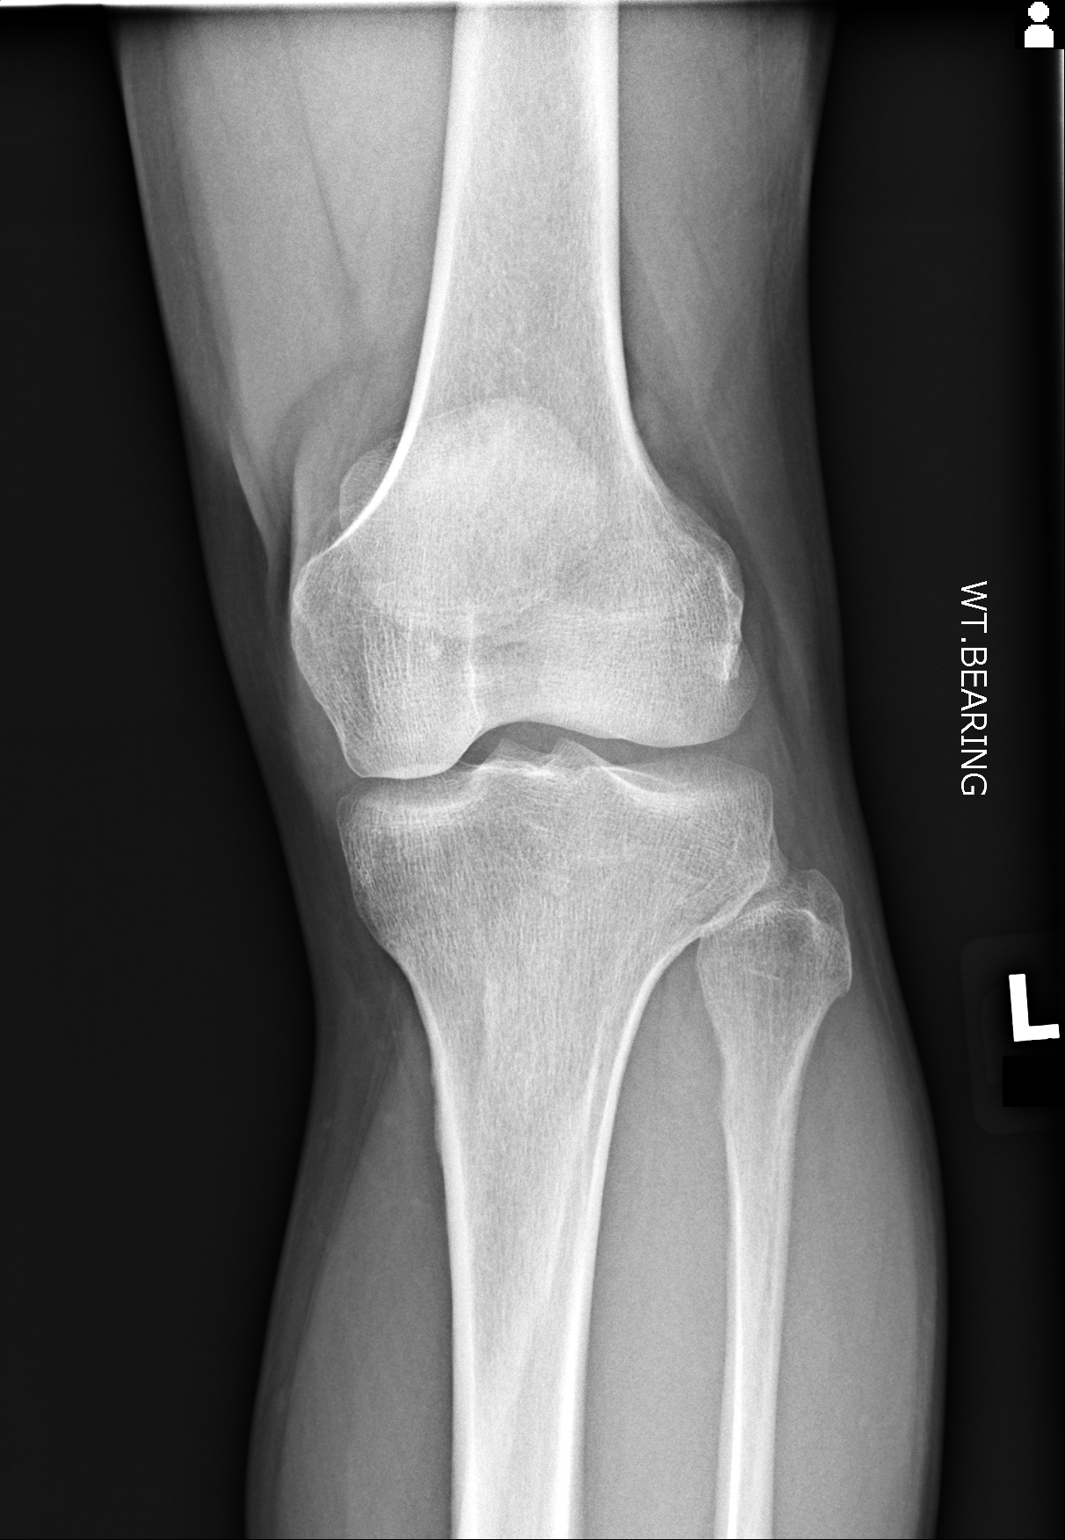
[im 2/2]
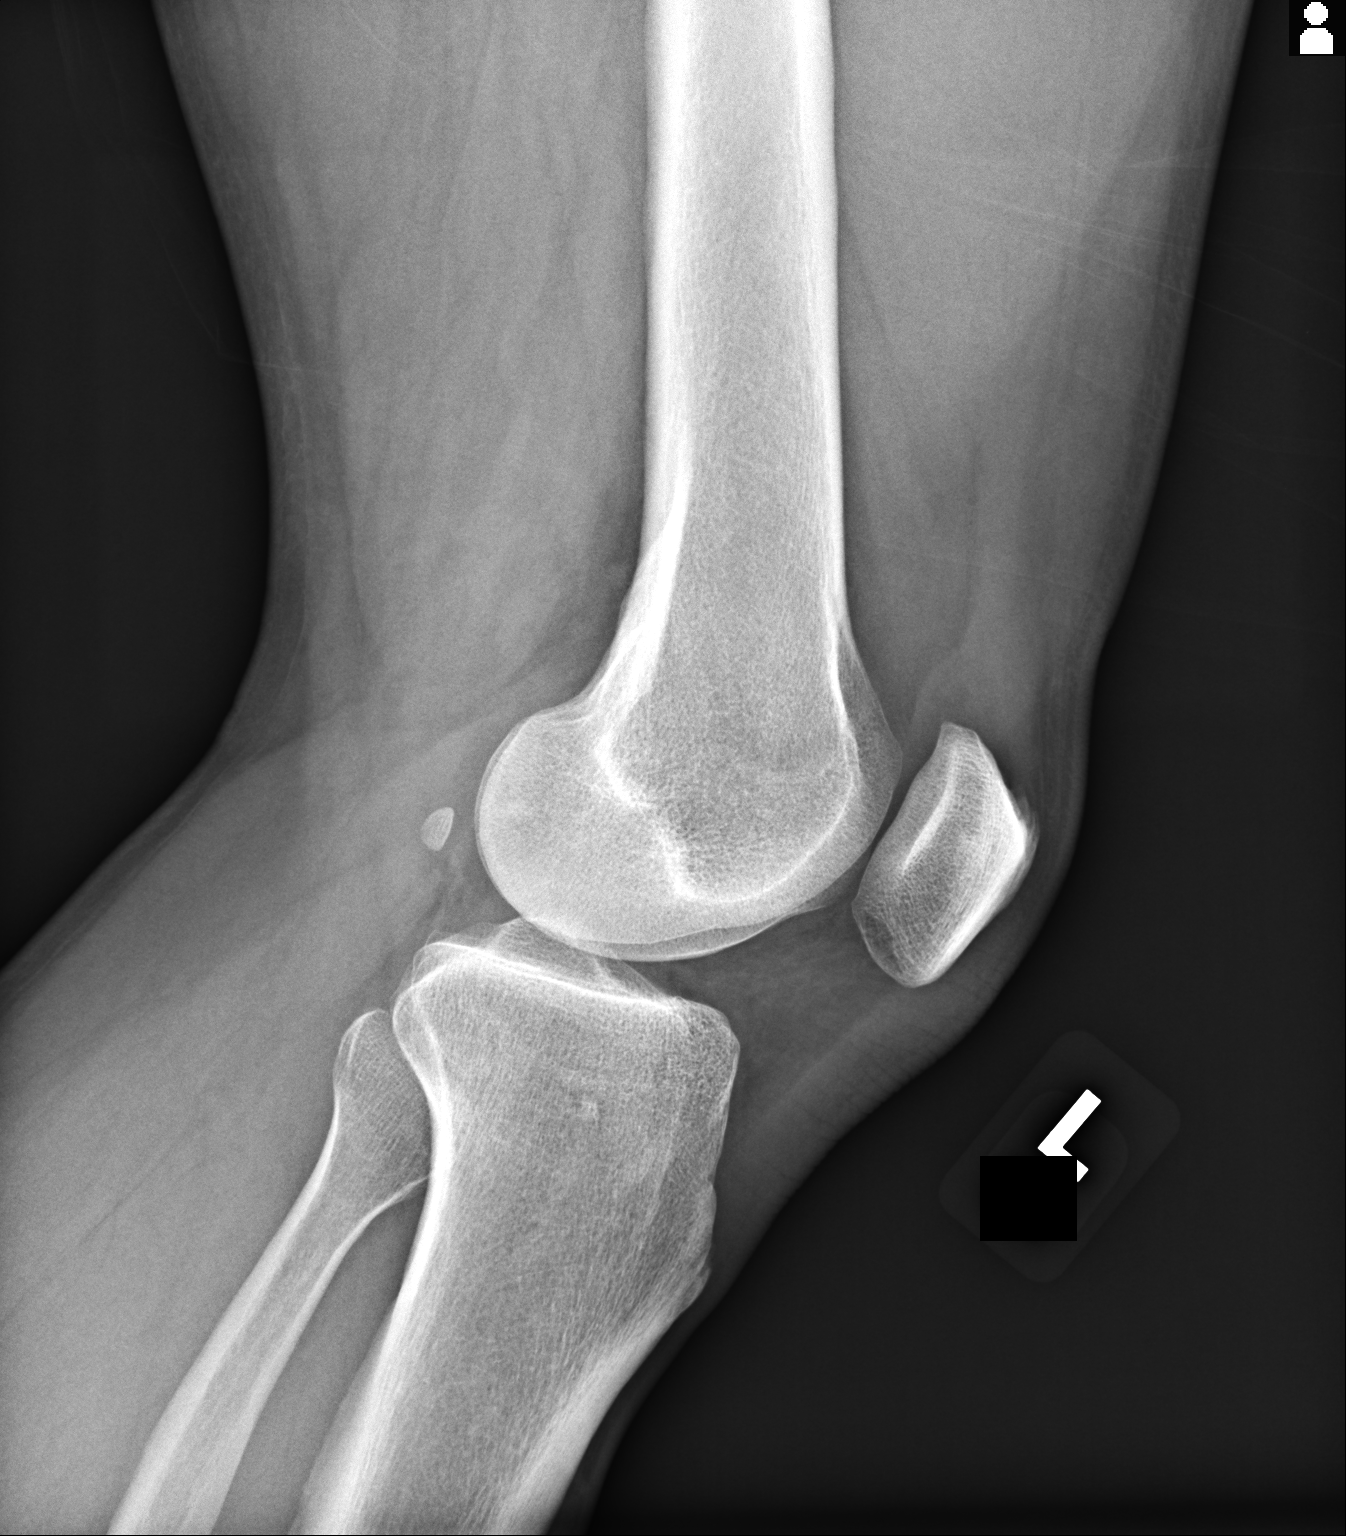

[2 of 2 positions shown; findings below may reference images not displayed]

FINDINGS: No acute fracture or dislocation. Joint spaces are maintained. Small effusion. Small superior patellar enthesophyte.
IMPRESSION: 1.  No acute fracture of the left knee.
2.  Small knee joint effusion. Consider further evaluation noncontrast MRI of the left knee to evaluate for internal derangement.

## 2022-09-15 IMAGING — CR ANKLE LT 2 VWS
1 series · 2 of 2 positions shown · non-contrast
Comparison: None

Images Obtained from [facility]
Left ankle radiograph, 2 views
INDICATION: Encounter for general adult medical examination without abnormal findings

[Series 1: lat · 0.17mm/px · 2 of 2 slices shown]
[im 1/2]
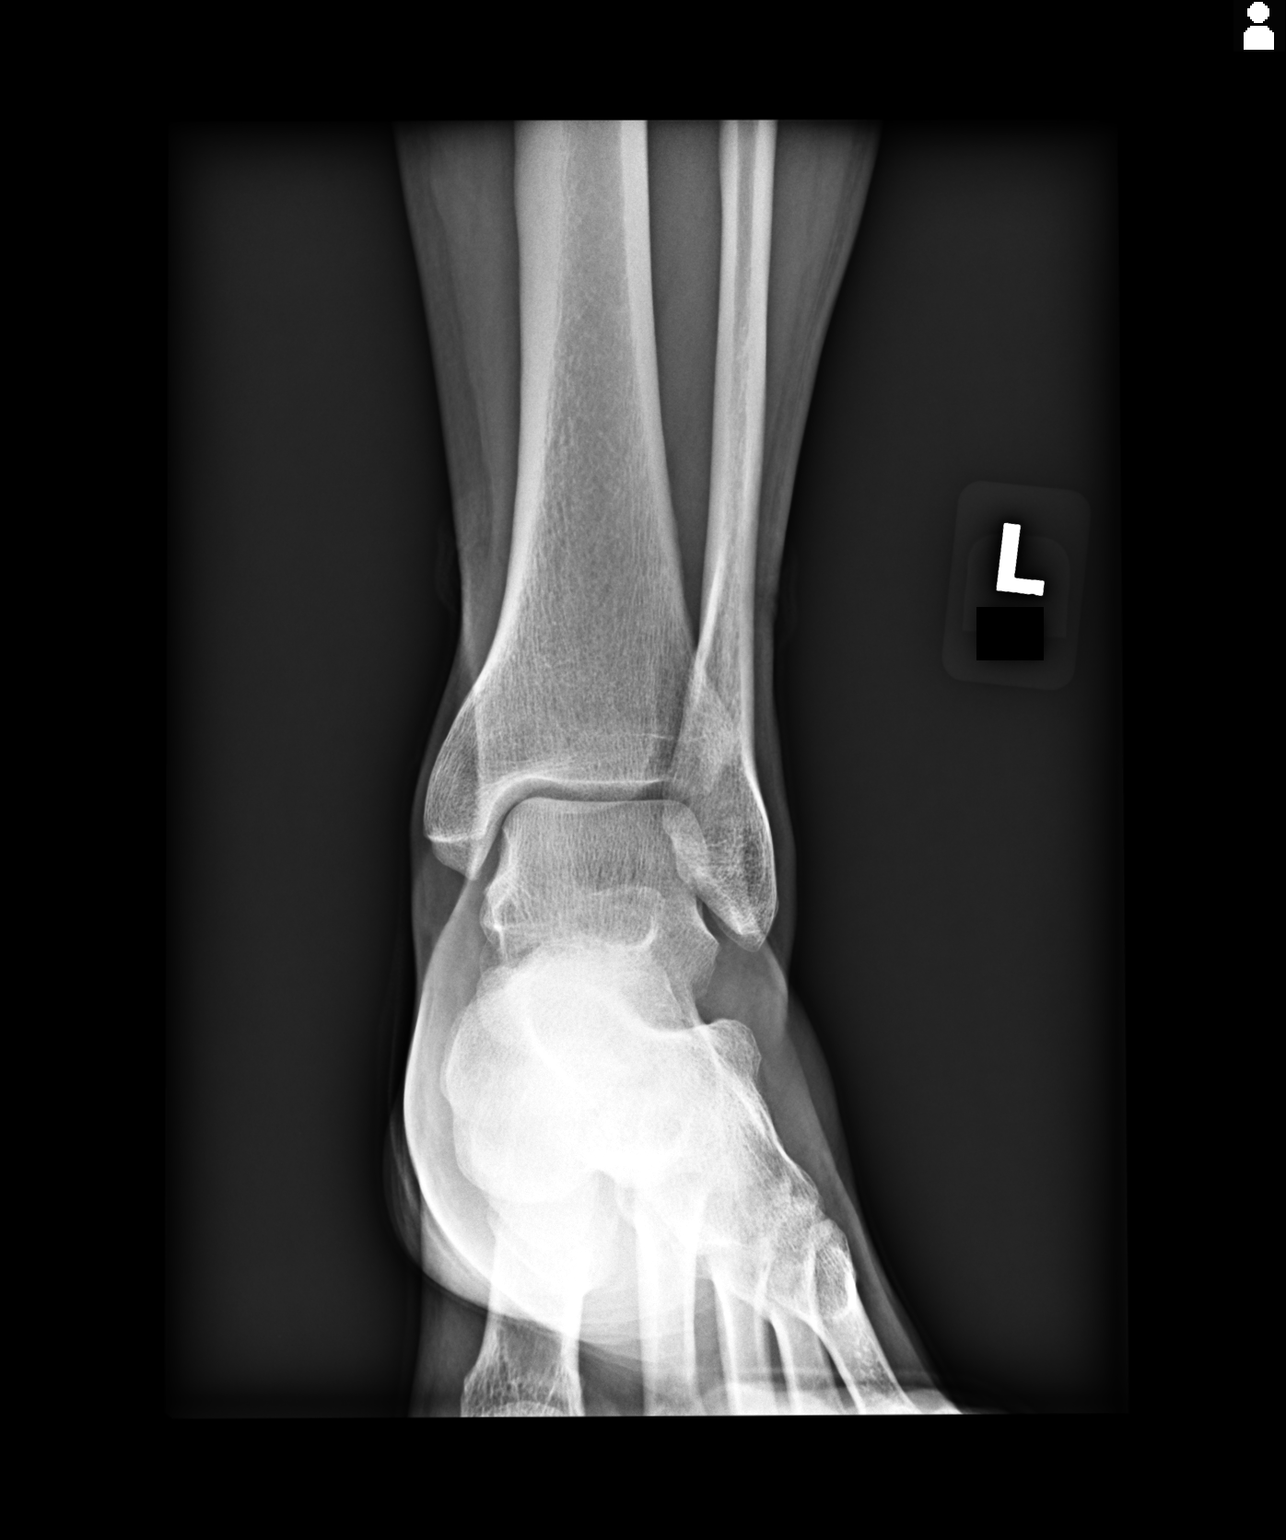
[im 2/2]
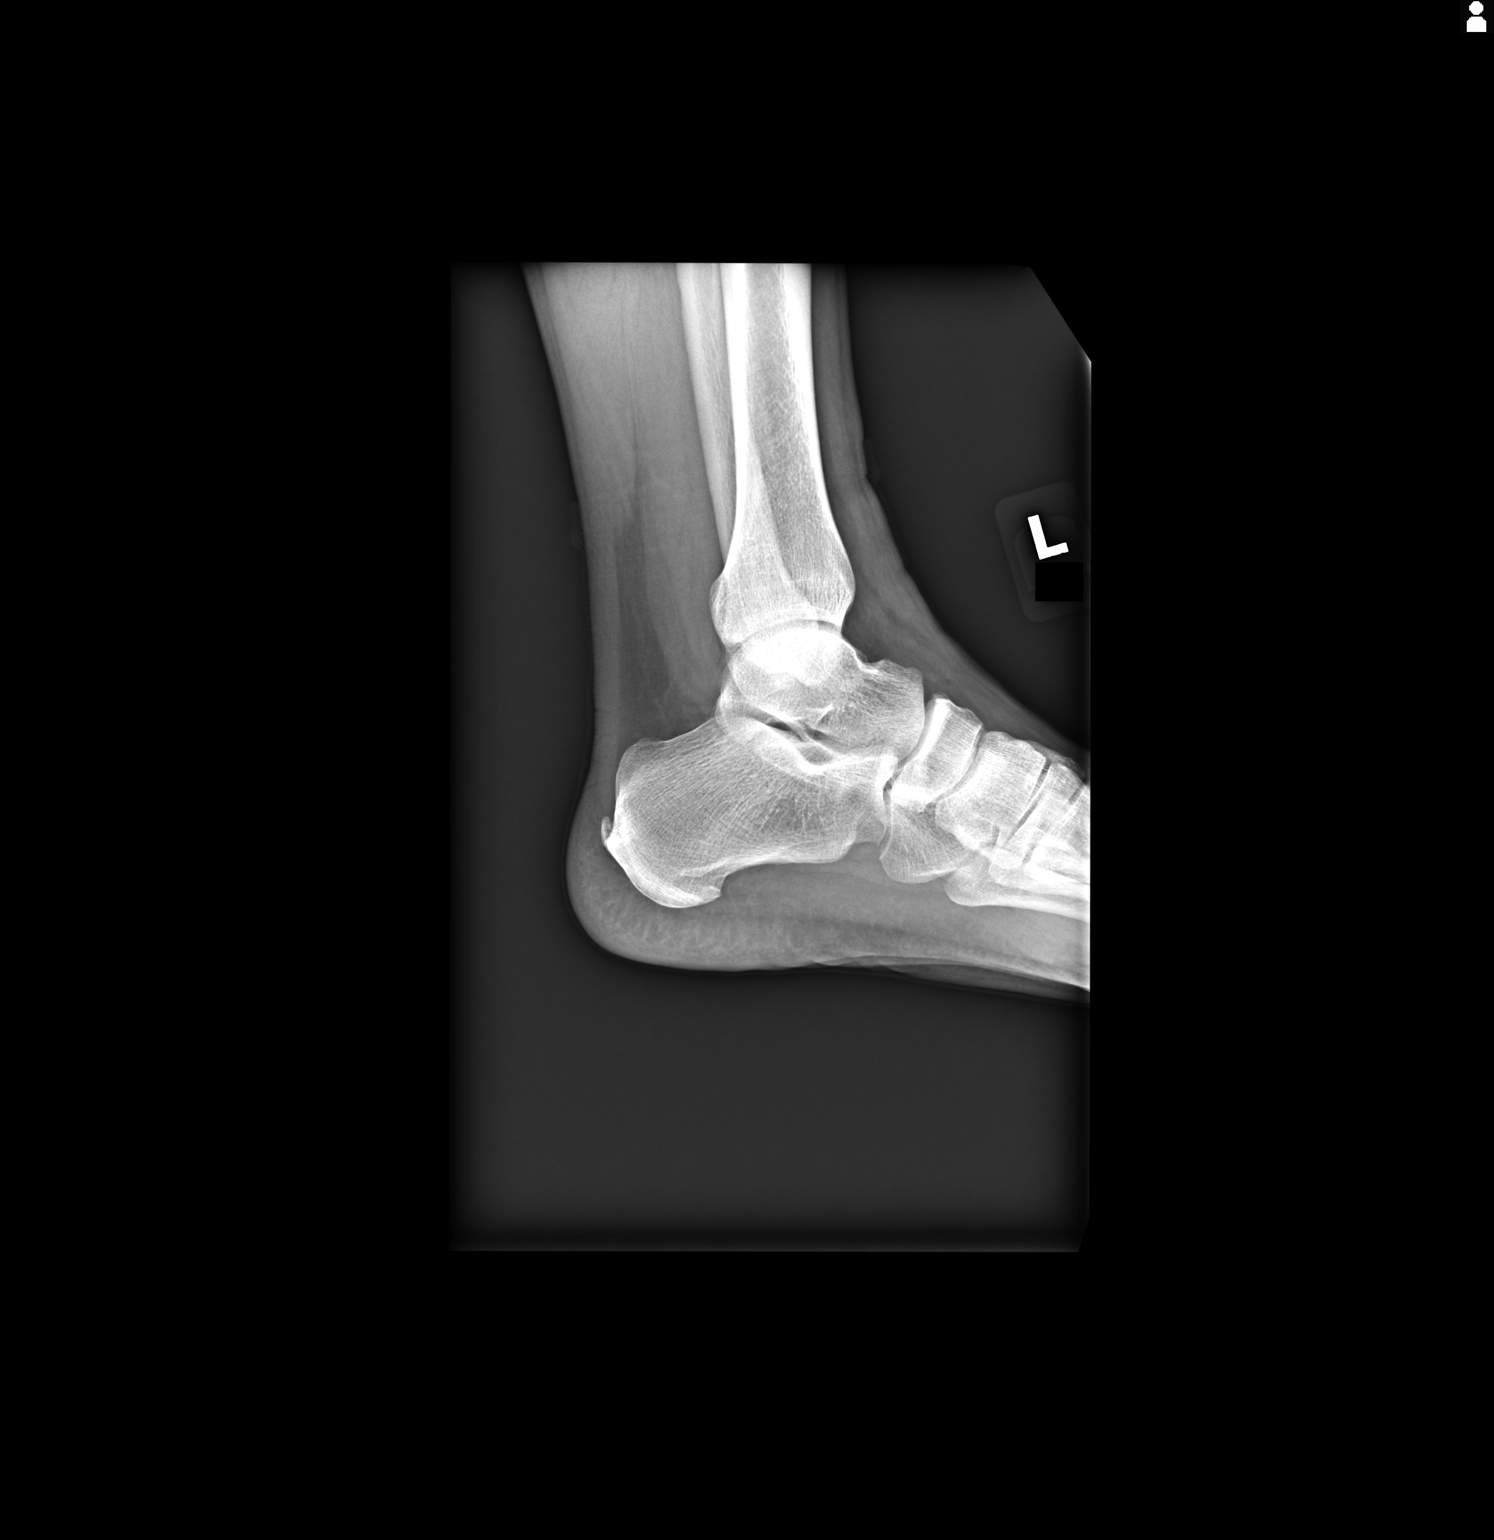

[2 of 2 positions shown; findings below may reference images not displayed]

FINDINGS: No acute fracture or dislocation. Normal mineralization. Small posterior calcaneal enthesophytes.
IMPRESSION: 1.  No acute fracture of the left ankle.
2.  Small posterior calcaneal enthesophytes.
